# Patient Record
Sex: Female | Born: 1960 | Race: White | Hispanic: No | Marital: Married | Smoking: Never smoker
Health system: Southern US, Community
[De-identification: ages and names within clinical notes are randomized; demographics above are authoritative.]

## PROBLEM LIST (undated history)

## (undated) DIAGNOSIS — I2699 Other pulmonary embolism without acute cor pulmonale: Secondary | ICD-10-CM

## (undated) DIAGNOSIS — R000 Tachycardia, unspecified: Secondary | ICD-10-CM

## (undated) DIAGNOSIS — F32A Depression, unspecified: Secondary | ICD-10-CM

## (undated) DIAGNOSIS — E079 Disorder of thyroid, unspecified: Secondary | ICD-10-CM

## (undated) DIAGNOSIS — M359 Systemic involvement of connective tissue, unspecified: Secondary | ICD-10-CM

## (undated) DIAGNOSIS — F329 Major depressive disorder, single episode, unspecified: Secondary | ICD-10-CM

## (undated) DIAGNOSIS — Z86711 Personal history of pulmonary embolism: Secondary | ICD-10-CM

## (undated) DIAGNOSIS — E039 Hypothyroidism, unspecified: Secondary | ICD-10-CM

## (undated) HISTORY — DX: Hypothyroidism, unspecified: E03.9

## (undated) HISTORY — DX: Personal history of pulmonary embolism: Z86.711

## (undated) HISTORY — DX: Tachycardia, unspecified: R00.0

## (undated) HISTORY — PX: ABDOMINAL HYSTERECTOMY: SHX81

## (undated) HISTORY — DX: Systemic involvement of connective tissue, unspecified: M35.9

---

## 1999-03-20 ENCOUNTER — Ambulatory Visit (HOSPITAL_BASED_OUTPATIENT_CLINIC_OR_DEPARTMENT_OTHER): Admission: RE | Admit: 1999-03-20 | Discharge: 1999-03-20 | Payer: Self-pay | Admitting: Otolaryngology

## 1999-10-02 ENCOUNTER — Other Ambulatory Visit: Admission: RE | Admit: 1999-10-02 | Discharge: 1999-10-02 | Payer: Self-pay | Admitting: Obstetrics & Gynecology

## 2000-12-28 ENCOUNTER — Other Ambulatory Visit: Admission: RE | Admit: 2000-12-28 | Discharge: 2000-12-28 | Payer: Self-pay | Admitting: Obstetrics & Gynecology

## 2002-01-31 ENCOUNTER — Other Ambulatory Visit: Admission: RE | Admit: 2002-01-31 | Discharge: 2002-01-31 | Payer: Self-pay | Admitting: Obstetrics & Gynecology

## 2003-04-05 ENCOUNTER — Other Ambulatory Visit: Admission: RE | Admit: 2003-04-05 | Discharge: 2003-04-05 | Payer: Self-pay | Admitting: Obstetrics & Gynecology

## 2004-02-06 ENCOUNTER — Encounter: Admission: RE | Admit: 2004-02-06 | Discharge: 2004-02-06 | Payer: Self-pay | Admitting: Obstetrics & Gynecology

## 2004-08-17 DIAGNOSIS — I2699 Other pulmonary embolism without acute cor pulmonale: Secondary | ICD-10-CM

## 2004-08-17 HISTORY — DX: Other pulmonary embolism without acute cor pulmonale: I26.99

## 2005-11-17 ENCOUNTER — Ambulatory Visit: Payer: Self-pay | Admitting: Oncology

## 2005-11-26 LAB — COMPREHENSIVE METABOLIC PANEL
ALT: 13 U/L (ref 0–40)
AST: 25 U/L (ref 0–37)
Chloride: 106 mEq/L (ref 96–112)
Potassium: 4.2 mEq/L (ref 3.5–5.3)
Sodium: 140 mEq/L (ref 135–145)

## 2005-11-26 LAB — CBC WITH DIFFERENTIAL/PLATELET
EOS%: 1.9 % (ref 0.0–7.0)
Eosinophils Absolute: 0.1 10*3/uL (ref 0.0–0.5)
HCT: 38.5 % (ref 34.8–46.6)
HGB: 12.7 g/dL (ref 11.6–15.9)
LYMPH%: 32.3 % (ref 14.0–48.0)
MCHC: 33 g/dL (ref 32.0–36.0)
MONO#: 0.4 10*3/uL (ref 0.1–0.9)
MONO%: 9.1 % (ref 0.0–13.0)
RBC: 4.51 10*6/uL (ref 3.70–5.32)
WBC: 4.8 10*3/uL (ref 3.9–10.0)
lymph#: 1.5 10*3/uL (ref 0.9–3.3)

## 2005-11-26 LAB — FERRITIN: Ferritin: 30 ng/mL (ref 10–291)

## 2005-11-26 LAB — IRON AND TIBC: TIBC: 385 ug/dL (ref 250–470)

## 2005-11-30 LAB — HYPERCOAGULABLE PANEL, COMPREHENSIVE
AntiThromb III Func: 99 % (ref 75–120)
Anticardiolipin IgA: 14 [APL'U]
Beta-2-Glycoprotein I IgA: 9 U/mL (ref <10)
DRVVT 1:1 Mix: 38.3 secs (ref 26.75–42.95)
DRVVT: 51.9 secs — ABNORMAL HIGH (ref 26.75–42.95)
PTT Lupus Anticoagulant: 47.8 secs — ABNORMAL HIGH (ref 30.5–43.1)
Protein S Ag, Total: 36 % — ABNORMAL LOW (ref 58–146)

## 2005-12-18 LAB — CBC WITH DIFFERENTIAL/PLATELET
BASO%: 0.3 % (ref 0.0–2.0)
HCT: 37.3 % (ref 34.8–46.6)
HGB: 12.6 g/dL (ref 11.6–15.9)
MCH: 30.8 pg (ref 26.0–34.0)
MCHC: 33.8 g/dL (ref 32.0–36.0)
NEUT#: 3.4 10*3/uL (ref 1.5–6.5)
NEUT%: 66.9 % (ref 39.6–76.8)
RBC: 4.09 10*6/uL (ref 3.70–5.32)
RDW: 19.2 % — ABNORMAL HIGH (ref 11.3–14.5)

## 2005-12-21 ENCOUNTER — Emergency Department (HOSPITAL_COMMUNITY): Admission: EM | Admit: 2005-12-21 | Discharge: 2005-12-21 | Payer: Self-pay | Admitting: Emergency Medicine

## 2005-12-30 ENCOUNTER — Ambulatory Visit (HOSPITAL_COMMUNITY): Admission: RE | Admit: 2005-12-30 | Discharge: 2005-12-30 | Payer: Self-pay | Admitting: Oncology

## 2006-03-07 ENCOUNTER — Ambulatory Visit: Payer: Self-pay | Admitting: Oncology

## 2006-04-27 ENCOUNTER — Inpatient Hospital Stay (HOSPITAL_COMMUNITY): Admission: AD | Admit: 2006-04-27 | Discharge: 2006-04-27 | Payer: Self-pay | Admitting: Obstetrics and Gynecology

## 2006-04-28 ENCOUNTER — Inpatient Hospital Stay (HOSPITAL_COMMUNITY): Admission: RE | Admit: 2006-04-28 | Discharge: 2006-04-30 | Payer: Self-pay | Admitting: Obstetrics and Gynecology

## 2006-05-07 ENCOUNTER — Inpatient Hospital Stay (HOSPITAL_COMMUNITY): Admission: AD | Admit: 2006-05-07 | Discharge: 2006-05-07 | Payer: Self-pay | Admitting: Obstetrics and Gynecology

## 2006-05-08 ENCOUNTER — Inpatient Hospital Stay (HOSPITAL_COMMUNITY): Admission: AD | Admit: 2006-05-08 | Discharge: 2006-05-11 | Payer: Self-pay | Admitting: Obstetrics and Gynecology

## 2011-07-24 ENCOUNTER — Encounter: Payer: Self-pay | Admitting: Emergency Medicine

## 2011-07-24 ENCOUNTER — Emergency Department (HOSPITAL_COMMUNITY): Payer: BC Managed Care – PPO

## 2011-07-24 ENCOUNTER — Emergency Department (HOSPITAL_COMMUNITY)
Admission: EM | Admit: 2011-07-24 | Discharge: 2011-07-24 | Disposition: A | Discharge status: Home / Self Care | Payer: BC Managed Care – PPO | Attending: Emergency Medicine | Admitting: Emergency Medicine

## 2011-07-24 DIAGNOSIS — R059 Cough, unspecified: Secondary | ICD-10-CM | POA: Insufficient documentation

## 2011-07-24 DIAGNOSIS — R0602 Shortness of breath: Secondary | ICD-10-CM | POA: Insufficient documentation

## 2011-07-24 DIAGNOSIS — J029 Acute pharyngitis, unspecified: Secondary | ICD-10-CM | POA: Insufficient documentation

## 2011-07-24 DIAGNOSIS — IMO0001 Reserved for inherently not codable concepts without codable children: Secondary | ICD-10-CM | POA: Insufficient documentation

## 2011-07-24 DIAGNOSIS — B9789 Other viral agents as the cause of diseases classified elsewhere: Secondary | ICD-10-CM | POA: Insufficient documentation

## 2011-07-24 DIAGNOSIS — R509 Fever, unspecified: Secondary | ICD-10-CM | POA: Insufficient documentation

## 2011-07-24 DIAGNOSIS — R05 Cough: Secondary | ICD-10-CM | POA: Insufficient documentation

## 2011-07-24 HISTORY — DX: Other pulmonary embolism without acute cor pulmonale: I26.99

## 2011-07-24 HISTORY — DX: Depression, unspecified: F32.A

## 2011-07-24 HISTORY — DX: Disorder of thyroid, unspecified: E07.9

## 2011-07-24 HISTORY — DX: Major depressive disorder, single episode, unspecified: F32.9

## 2011-07-24 LAB — D-DIMER, QUANTITATIVE: D-Dimer, Quant: 0.63 ug/mL-FEU — ABNORMAL HIGH (ref 0.00–0.48)

## 2011-07-24 MED ORDER — ACETAMINOPHEN 325 MG PO TABS
ORAL_TABLET | ORAL | Status: AC
  Filled 2011-07-24: qty 2

## 2011-07-24 MED ORDER — IOHEXOL 300 MG/ML  SOLN
100.0000 mL | Freq: Once | INTRAMUSCULAR | Status: AC | PRN
  Administered 2011-07-24: 100 mL via INTRAVENOUS

## 2011-07-24 MED ORDER — SODIUM CHLORIDE 0.9 % IV BOLUS (SEPSIS)
1000.0000 mL | Freq: Once | INTRAVENOUS | Status: AC
  Administered 2011-07-24: 1000 mL via INTRAVENOUS

## 2011-07-24 MED ORDER — ACETAMINOPHEN 500 MG PO TABS
1000.0000 mg | ORAL_TABLET | Freq: Once | ORAL | Status: AC
  Administered 2011-07-24: 1000 mg via ORAL
  Filled 2011-07-24 (×2): qty 2

## 2011-07-24 MED ORDER — ALBUTEROL SULFATE HFA 108 (90 BASE) MCG/ACT IN AERS: 2.0000 | INHALATION_SPRAY | RESPIRATORY_TRACT | Status: DC | PRN

## 2011-07-24 MED ORDER — KETOROLAC TROMETHAMINE 30 MG/ML IJ SOLN
30.0000 mg | Freq: Once | INTRAMUSCULAR | Status: AC
  Administered 2011-07-24: 30 mg via INTRAVENOUS
  Filled 2011-07-24: qty 1

## 2011-07-24 MED ORDER — ALBUTEROL SULFATE (5 MG/ML) 0.5% IN NEBU
5.0000 mg | INHALATION_SOLUTION | RESPIRATORY_TRACT | Status: AC
  Administered 2011-07-24: 5 mg via RESPIRATORY_TRACT
  Filled 2011-07-24: qty 1

## 2011-07-24 MED ORDER — IPRATROPIUM BROMIDE 0.02 % IN SOLN
0.5000 mg | Freq: Once | RESPIRATORY_TRACT | Status: AC
  Administered 2011-07-24: 0.5 mg via RESPIRATORY_TRACT
  Filled 2011-07-24: qty 2.5

## 2011-07-24 NOTE — ED Notes (Signed)
Rt sided chest pain and fever since Sunday  cough

## 2011-07-24 NOTE — ED Notes (Signed)
States has been sick since Sunday and started to run a fever went to dr today and was sent for further tests states ahs been lightheaded when she coughs and hurts in her chest when she coughs

## 2011-07-24 NOTE — ED Notes (Signed)
Patient transported to CT 

## 2011-07-24 NOTE — ED Provider Notes (Signed)
History     CSN: [id number] Arrival date & time: 07/24/2011  1:41 PM   First MD Initiated Contact with Patient 07/24/11 1533      Chief Complaint  Patient presents with  . Shortness of Breath    (Consider location/radiation/quality/duration/timing/severity/associated sxs/prior treatment) HPI Comments: Patient reports she and spouse both began with "tickle" in their throats 6 days ago, were seen at [facility] walk-in [facility] the next day, started on z-pak.  Patient states she continues to have fevers to 102 at home, cough productive of yellow sputum, sore throat, body aches.  Patient is not eating well at home, reports increased soreness in her chest that is worse with cough.  SOB is worse with laying flat.  Patient states she is feeling less SOB on [place].  Patient has had PE in the past that was due to a combination of birth control pills and low car trip.  Patient currently is not taking any exogenous estrogen, has been on no long trips, no recent immobilization, no swelling in her legs.    The history is provided by the patient and the spouse.    Past Medical History  Diagnosis Date  . Depression   . Thyroid disease     No past surgical history on file.  No family history on file.  History  Substance Use Topics  . Smoking status: Never Smoker   . Smokeless tobacco: Not on file  . Alcohol Use: Yes    OB History    Grav Para Term Preterm Abortions TAB SAB Ect Mult Living                  Review of Systems  All other systems reviewed and are negative.    Allergies  Erythromycin; Morphine and related; Penicillins; and Sulfa antibiotics  Home Medications   Current Outpatient Rx  Name Route Sig Dispense Refill  . AZITHROMYCIN 250 MG PO TABS Oral Take 250-500 mg by mouth daily. Take 2 tablets on day 1, then 1 tablet daily for 5 days     . ZYRTEC-D PO Oral Take 1 tablet by mouth daily as needed. For congestion     . CITALOPRAM HYDROBROMIDE 40 MG PO TABS Oral Take 40 mg by  mouth daily.      Marland Kitchen FLUTICASONE PROPIONATE 50 MCG/ACT NA SUSP Nasal Place 2 sprays into the nose daily.      . GUAIFENESIN-CODEINE 100-10 MG/5ML PO SYRP Oral Take 5 mLs by mouth every 4 (four) hours.      . IBUPROFEN 200 MG PO TABS Oral Take 600 mg by mouth every 6 (six) hours as needed. For pain     . LEVOTHYROXINE SODIUM 100 MCG PO TABS Oral Take 100 mcg by mouth daily. Take with tablet for a total of daily     . LEVOTHYROXINE SODIUM 88 MCG PO TABS Oral Take 88 mcg by mouth daily. Take with tablet for a total of daily     . TRAZODONE HCL 50 MG PO TABS Oral Take 50 mg by mouth at bedtime.      . TRIAMTERENE-HCTZ 37.5-25 MG PO CAPS Oral Take 1 capsule by mouth every morning.        BP 122/81  Pulse 92  Temp(Src) 99.8 F (37.7 C) (Oral)  Resp 24  SpO2 98%  Physical Exam  Nursing note and vitals reviewed. Constitutional: She is oriented to person, place, and time. She appears well-developed and well-nourished.  HENT:  Head: Normocephalic  and atraumatic.  Mouth/Throat: No posterior oropharyngeal edema or posterior oropharyngeal erythema.  Neck: Neck supple. No tracheal deviation present.  Cardiovascular: Normal rate and regular rhythm.   Pulmonary/Chest: Effort normal and breath sounds normal. No respiratory distress. She has no wheezes. She has no rales. She exhibits tenderness.       Central anterior chest tender to palpation, reproduces pain patient has been feeling.    Neurological: She is alert and oriented to person, place, and time.    ED Course  Procedures (including critical care time)  Labs Reviewed - No data to display Dg Chest 2 View  07/24/2011  *RADIOLOGY REPORT*  Clinical Data: Cough, weakness and shortness of breath.  CHEST - 2 VIEW  Comparison: CT chest and plain film chest 12/21/2005.  Findings: Lungs are clear.  Heart size is normal.  No pneumothorax or pleural effusion.  IMPRESSION: No acute disease.  Original Report Authenticated By:  Bernadene Bell. D'ALESSIO, M.D.   5:13 PM Patient reports improvement in SOB after nebulizer treatment.  Discussed patient with Dr Weldon Inches.  Have ordered d-dimer as patient has hx PE.    6:43 PM Patient reports improvement with nebulizer treatment and toradol.  Requests second treatment.  Discussed plan for Ct angio.  Pt verbalizes understanding.    1. Viral respiratory illness       MDM  Patient with flu-like illness.  Hx of PE with negative CT angio chest.  Pt feeling better after nebulizer treatments and toradol.  Discussed all results and recommendations for care with patient.          Dillard Cannon [place], [place] 07/24/11 [phone]

## 2011-07-25 NOTE — ED Provider Notes (Signed)
Medical screening examination/treatment/procedure(s) were performed by non-physician practitioner and as supervising physician I was immediately available for consultation/collaboration.  Nicholes Stairs, MD 07/25/11 1510

## 2012-05-02 ENCOUNTER — Other Ambulatory Visit: Payer: Self-pay | Admitting: Obstetrics and Gynecology

## 2013-05-05 ENCOUNTER — Other Ambulatory Visit: Payer: Self-pay | Admitting: Obstetrics and Gynecology

## 2014-05-22 ENCOUNTER — Other Ambulatory Visit: Payer: Self-pay | Admitting: Obstetrics and Gynecology

## 2014-05-23 LAB — CYTOLOGY - PAP

## 2014-06-28 ENCOUNTER — Other Ambulatory Visit: Payer: Self-pay | Admitting: Family Medicine

## 2014-06-28 DIAGNOSIS — M25521 Pain in right elbow: Secondary | ICD-10-CM

## 2014-07-07 ENCOUNTER — Other Ambulatory Visit: Payer: BC Managed Care – PPO

## 2014-08-27 ENCOUNTER — Ambulatory Visit
Admission: RE | Admit: 2014-08-27 | Discharge: 2014-08-27 | Disposition: A | Discharge status: Home / Self Care | Payer: BLUE CROSS/BLUE SHIELD | Source: Ambulatory Visit | Attending: Family Medicine | Admitting: Family Medicine

## 2014-08-27 DIAGNOSIS — M25521 Pain in right elbow: Secondary | ICD-10-CM

## 2014-12-20 ENCOUNTER — Emergency Department (HOSPITAL_COMMUNITY)
Admission: EM | Admit: 2014-12-20 | Discharge: 2014-12-20 | Disposition: A | Discharge status: Home / Self Care | Payer: BLUE CROSS/BLUE SHIELD | Attending: Emergency Medicine | Admitting: Emergency Medicine

## 2014-12-20 ENCOUNTER — Encounter (HOSPITAL_COMMUNITY): Payer: Self-pay | Admitting: Emergency Medicine

## 2014-12-20 DIAGNOSIS — Z86711 Personal history of pulmonary embolism: Secondary | ICD-10-CM | POA: Insufficient documentation

## 2014-12-20 DIAGNOSIS — Z79899 Other long term (current) drug therapy: Secondary | ICD-10-CM | POA: Diagnosis not present

## 2014-12-20 DIAGNOSIS — Z880 Allergy status to penicillin: Secondary | ICD-10-CM | POA: Insufficient documentation

## 2014-12-20 DIAGNOSIS — F329 Major depressive disorder, single episode, unspecified: Secondary | ICD-10-CM | POA: Insufficient documentation

## 2014-12-20 DIAGNOSIS — E079 Disorder of thyroid, unspecified: Secondary | ICD-10-CM | POA: Insufficient documentation

## 2014-12-20 DIAGNOSIS — R040 Epistaxis: Secondary | ICD-10-CM | POA: Diagnosis present

## 2014-12-20 MED ORDER — OXYMETAZOLINE HCL 0.05 % NA SOLN
1.0000 | Freq: Once | NASAL | Status: AC
  Administered 2014-12-20: 1 via NASAL
  Filled 2014-12-20: qty 15

## 2014-12-20 MED ORDER — SILVER NITRATE-POT NITRATE 75-25 % EX MISC
1.0000 | Freq: Once | CUTANEOUS | Status: AC
  Administered 2014-12-20: 1 via TOPICAL
  Filled 2014-12-20: qty 1

## 2014-12-20 NOTE — ED Notes (Addendum)
Per EMS pt comes from home for nose bleed. Pt had one on Saturday and has been using Afrin nose spray.  Pt was talking on the phone today and started having blood pour out of both nostrils again.  Bleeding is controled on the scene.

## 2014-12-20 NOTE — ED Notes (Signed)
MD at bedside. 

## 2014-12-20 NOTE — ED Provider Notes (Signed)
CSN: [id number]     Arrival date & time 12/20/14  1541 History   First MD Initiated Contact with Patient 12/20/14 1601     Chief Complaint  Patient presents with  . Epistaxis     HPI  She presents for evaluation of epistaxis. She has slight bleeding from her left nares on Saturday. She been using Afrin daily or twice a day for the last 2 weeks because of the nasal allergy symptoms. Does use Flonase and Claritin as well. Sudden gush of blood from the left naris today. Started from the right naris at home. Presents here. Bleeding has slowed upon her arrival. No past history of significant nosebleeds. Not anticoagulated.  Past Medical History  Diagnosis Date  . Depression   . Thyroid disease   . PE (pulmonary embolism) 2006   Past Surgical History  Procedure Laterality Date  . Abdominal hysterectomy     No family history on file. History  Substance Use Topics  . Smoking status: Never Smoker   . Smokeless tobacco: Not on file  . Alcohol Use: Yes     Comment: 5x per week   OB History    No data available     Review of Systems  Constitutional: Negative for fever, chills, diaphoresis, appetite change and fatigue.  HENT: Positive for congestion, nosebleeds and sneezing. Negative for mouth sores, sore throat and trouble swallowing.   Eyes: Negative for visual disturbance.  Respiratory: Negative for cough, chest tightness, shortness of breath and wheezing.   Cardiovascular: Negative for chest pain.  Gastrointestinal: Negative for nausea, vomiting, abdominal pain, diarrhea and abdominal distention.  Endocrine: Negative for polydipsia, polyphagia and polyuria.  Genitourinary: Negative for dysuria, frequency and hematuria.  Musculoskeletal: Negative for gait problem.  Skin: Negative for color change, pallor and rash.  Neurological: Negative for dizziness, syncope, light-headedness and headaches.  Hematological: Does not bruise/bleed easily.  Psychiatric/Behavioral: Negative for  behavioral problems and confusion.      Allergies  Erythromycin; Morphine and related; Penicillins; and Sulfa antibiotics  Home Medications   Prior to Admission medications   Medication Sig Start Date End Date Taking? Authorizing Provider  ARMOUR THYROID 120 MG tablet Take 1 tablet by mouth daily. 12/12/14  Yes Historical Provider, MD  ARMOUR THYROID 30 MG tablet Take 1 tablet by mouth daily. Take with 1 120mg  tablet for a total of 150mg  daily. 12/12/14  Yes Historical Provider, MD  calcium carbonate (OS-CAL) 1250 (500 CA) MG chewable tablet Chew 1 tablet by mouth daily.   Yes Historical Provider, MD  Cetirizine-Pseudoephedrine (ZYRTEC-D PO) Take 1 tablet by mouth daily as needed (congestion).    Yes Historical Provider, MD  fluticasone (FLONASE) 50 MCG/ACT nasal spray Place 2 sprays into the nose daily as needed for allergies.    Yes Historical Provider, MD  ibuprofen (ADVIL,MOTRIN) 200 MG tablet Take 600 mg by mouth every 6 (six) hours as needed for headache or moderate pain.    Yes Historical Provider, MD  meloxicam (MOBIC) 7.5 MG tablet Take 1-2 tablets by mouth at bedtime. 12/02/14  Yes Historical Provider, MD  Multiple Vitamins-Minerals (MULTIVITAMIN ADULT PO) Take 1 tablet by mouth daily.   Yes Historical Provider, MD  traZODone (DESYREL) 50 MG tablet Take 50 mg by mouth at bedtime.     Yes Historical Provider, MD  albuterol (PROVENTIL HFA;VENTOLIN HFA) 108 (90 BASE) MCG/ACT inhaler Inhale 2 puffs into the lungs every 4 (four) hours as needed for wheezing or shortness of breath. 07/24/11 07/23/12  Irving BurtonEmily  West, PA-C   BP 129/91 mmHg  Pulse 74  Temp(Src) 97.6 F (36.4 C) (Oral)  Resp 19  SpO2 97% Physical Exam  Constitutional: She is oriented to person, place, and time. She appears well-developed and well-nourished. No distress.  HENT:  Head: Normocephalic.  Nose:    Eyes: Conjunctivae are normal. Pupils are equal, round, and reactive to light. No scleral icterus.  Neck: Normal  range of motion. Neck supple. No thyromegaly present.  Cardiovascular: Normal rate and regular rhythm.  Exam reveals no gallop and no friction rub.   No murmur heard. Pulmonary/Chest: Effort normal and breath sounds normal. No respiratory distress. She has no wheezes. She has no rales.  Abdominal: Soft. Bowel sounds are normal. She exhibits no distension. There is no tenderness. There is no rebound.  Musculoskeletal: Normal range of motion.  Neurological: She is alert and oriented to person, place, and time.  Skin: Skin is warm and dry. No rash noted.  Psychiatric: She has a normal mood and affect. Her behavior is normal.    ED Course  Procedures (including critical care time) Labs Review Labs Reviewed - No data to display  Imaging Review No results found.   EKG Interpretation None      MDM   Final diagnoses:  Epistaxis    [place] with Afrin placed into the nares for 20 minutes. Then removed. No additional bleeding noted. Cauterized with silver nitrate cautery. Excellent hemostasis. No bleeding in the posterior pharynx. No bleeding noted in the right naris.  Rechecked 15 and 30 minutes. No additional bleeding noted. Discussed with her about holding pressure for any rebleeding. Avoid herNasal sprays. Continue Flonase in the opposite nostril. Resume your current Flonase in  3-4 days. No aspirin. No afrin . ENT follow-up as needed. ER as needed.    Rolland PorterMark Trew Sunde, MD 12/20/14 [phone]

## 2014-12-20 NOTE — Discharge Instructions (Signed)

## 2014-12-20 NOTE — ED Notes (Signed)
Bed: ZO10WA14 Expected date:  Expected time:  Means of arrival:  Comments: EMS- 54yo F, nosebleed

## 2015-11-19 DIAGNOSIS — J301 Allergic rhinitis due to pollen: Secondary | ICD-10-CM | POA: Diagnosis not present

## 2015-11-19 DIAGNOSIS — J3089 Other allergic rhinitis: Secondary | ICD-10-CM | POA: Diagnosis not present

## 2015-11-26 DIAGNOSIS — J301 Allergic rhinitis due to pollen: Secondary | ICD-10-CM | POA: Diagnosis not present

## 2015-11-26 DIAGNOSIS — J3089 Other allergic rhinitis: Secondary | ICD-10-CM | POA: Diagnosis not present

## 2015-12-03 DIAGNOSIS — J301 Allergic rhinitis due to pollen: Secondary | ICD-10-CM | POA: Diagnosis not present

## 2015-12-03 DIAGNOSIS — J3081 Allergic rhinitis due to animal (cat) (dog) hair and dander: Secondary | ICD-10-CM | POA: Diagnosis not present

## 2015-12-10 DIAGNOSIS — J3089 Other allergic rhinitis: Secondary | ICD-10-CM | POA: Diagnosis not present

## 2015-12-10 DIAGNOSIS — J301 Allergic rhinitis due to pollen: Secondary | ICD-10-CM | POA: Diagnosis not present

## 2015-12-17 DIAGNOSIS — J3089 Other allergic rhinitis: Secondary | ICD-10-CM | POA: Diagnosis not present

## 2015-12-17 DIAGNOSIS — J301 Allergic rhinitis due to pollen: Secondary | ICD-10-CM | POA: Diagnosis not present

## 2015-12-24 DIAGNOSIS — J301 Allergic rhinitis due to pollen: Secondary | ICD-10-CM | POA: Diagnosis not present

## 2015-12-24 DIAGNOSIS — J3089 Other allergic rhinitis: Secondary | ICD-10-CM | POA: Diagnosis not present

## 2015-12-30 DIAGNOSIS — J301 Allergic rhinitis due to pollen: Secondary | ICD-10-CM | POA: Diagnosis not present

## 2015-12-30 DIAGNOSIS — J3089 Other allergic rhinitis: Secondary | ICD-10-CM | POA: Diagnosis not present

## 2015-12-31 DIAGNOSIS — J3089 Other allergic rhinitis: Secondary | ICD-10-CM | POA: Diagnosis not present

## 2015-12-31 DIAGNOSIS — J301 Allergic rhinitis due to pollen: Secondary | ICD-10-CM | POA: Diagnosis not present

## 2016-01-07 DIAGNOSIS — J301 Allergic rhinitis due to pollen: Secondary | ICD-10-CM | POA: Diagnosis not present

## 2016-01-07 DIAGNOSIS — J3089 Other allergic rhinitis: Secondary | ICD-10-CM | POA: Diagnosis not present

## 2016-01-14 DIAGNOSIS — J3089 Other allergic rhinitis: Secondary | ICD-10-CM | POA: Diagnosis not present

## 2016-01-14 DIAGNOSIS — J301 Allergic rhinitis due to pollen: Secondary | ICD-10-CM | POA: Diagnosis not present

## 2016-01-21 DIAGNOSIS — J3089 Other allergic rhinitis: Secondary | ICD-10-CM | POA: Diagnosis not present

## 2016-01-21 DIAGNOSIS — J301 Allergic rhinitis due to pollen: Secondary | ICD-10-CM | POA: Diagnosis not present

## 2016-01-28 DIAGNOSIS — J3089 Other allergic rhinitis: Secondary | ICD-10-CM | POA: Diagnosis not present

## 2016-01-28 DIAGNOSIS — M25561 Pain in right knee: Secondary | ICD-10-CM | POA: Diagnosis not present

## 2016-01-28 DIAGNOSIS — M064 Inflammatory polyarthropathy: Secondary | ICD-10-CM | POA: Diagnosis not present

## 2016-01-28 DIAGNOSIS — L932 Other local lupus erythematosus: Secondary | ICD-10-CM | POA: Diagnosis not present

## 2016-01-28 DIAGNOSIS — J301 Allergic rhinitis due to pollen: Secondary | ICD-10-CM | POA: Diagnosis not present

## 2016-01-28 DIAGNOSIS — M359 Systemic involvement of connective tissue, unspecified: Secondary | ICD-10-CM | POA: Diagnosis not present

## 2016-02-03 DIAGNOSIS — H524 Presbyopia: Secondary | ICD-10-CM | POA: Diagnosis not present

## 2016-02-04 DIAGNOSIS — J301 Allergic rhinitis due to pollen: Secondary | ICD-10-CM | POA: Diagnosis not present

## 2016-02-04 DIAGNOSIS — J3089 Other allergic rhinitis: Secondary | ICD-10-CM | POA: Diagnosis not present

## 2016-02-12 DIAGNOSIS — Z79899 Other long term (current) drug therapy: Secondary | ICD-10-CM | POA: Diagnosis not present

## 2016-02-12 DIAGNOSIS — J3089 Other allergic rhinitis: Secondary | ICD-10-CM | POA: Diagnosis not present

## 2016-02-12 DIAGNOSIS — J301 Allergic rhinitis due to pollen: Secondary | ICD-10-CM | POA: Diagnosis not present

## 2016-09-03 ENCOUNTER — Telehealth: Payer: Self-pay | Admitting: Oncology

## 2016-09-03 ENCOUNTER — Encounter: Payer: Self-pay | Admitting: Oncology

## 2016-09-03 NOTE — Telephone Encounter (Signed)
Tc to pt to schedule appt w/Dr. Allyson SabalXhou on 1/30 at 11am. Pt aware to arrive 30 minutes early. Demographics verified. Letter mailed.

## 2016-09-10 ENCOUNTER — Telehealth: Payer: Self-pay | Admitting: Hematology

## 2016-09-10 NOTE — Telephone Encounter (Signed)
Pt has been rescheduled to see Dr. Candise CheKale on 2/8 at 11am. Aware to arrive 30 minutes early

## 2016-09-24 ENCOUNTER — Encounter: Payer: Self-pay | Admitting: Hematology

## 2016-09-24 ENCOUNTER — Telehealth: Payer: Self-pay | Admitting: Hematology

## 2016-09-24 ENCOUNTER — Ambulatory Visit (HOSPITAL_BASED_OUTPATIENT_CLINIC_OR_DEPARTMENT_OTHER): Payer: Managed Care, Other (non HMO)

## 2016-09-24 ENCOUNTER — Ambulatory Visit (HOSPITAL_BASED_OUTPATIENT_CLINIC_OR_DEPARTMENT_OTHER): Payer: Managed Care, Other (non HMO) | Admitting: Hematology

## 2016-09-24 VITALS — BP 114/60 | HR 111 | Temp 98.3°F | Resp 17 | Ht 64.0 in | Wt 165.1 lb

## 2016-09-24 DIAGNOSIS — D510 Vitamin B12 deficiency anemia due to intrinsic factor deficiency: Secondary | ICD-10-CM | POA: Diagnosis not present

## 2016-09-24 DIAGNOSIS — D7589 Other specified diseases of blood and blood-forming organs: Secondary | ICD-10-CM

## 2016-09-24 DIAGNOSIS — E538 Deficiency of other specified B group vitamins: Secondary | ICD-10-CM | POA: Diagnosis not present

## 2016-09-24 DIAGNOSIS — E039 Hypothyroidism, unspecified: Secondary | ICD-10-CM | POA: Diagnosis not present

## 2016-09-24 LAB — CBC & DIFF AND RETIC
BASO%: 0.9 % (ref 0.0–2.0)
Basophils Absolute: 0 10*3/uL (ref 0.0–0.1)
EOS%: 2.6 % (ref 0.0–7.0)
Eosinophils Absolute: 0.1 10*3/uL (ref 0.0–0.5)
HCT: 41.2 % (ref 34.8–46.6)
HGB: 14.1 g/dL (ref 11.6–15.9)
IMMATURE RETIC FRACT: 5.1 % (ref 1.60–10.00)
LYMPH#: 1.2 10*3/uL (ref 0.9–3.3)
LYMPH%: 28.1 % (ref 14.0–49.7)
MCH: 36.8 pg — ABNORMAL HIGH (ref 25.1–34.0)
MCHC: 34.2 g/dL (ref 31.5–36.0)
MCV: 107.6 fL — ABNORMAL HIGH (ref 79.5–101.0)
MONO#: 0.3 10*3/uL (ref 0.1–0.9)
MONO%: 6.7 % (ref 0.0–14.0)
NEUT#: 2.7 10*3/uL (ref 1.5–6.5)
NEUT%: 61.7 % (ref 38.4–76.8)
Platelets: 224 10*3/uL (ref 145–400)
RBC: 3.83 10*6/uL (ref 3.70–5.45)
RDW: 12.7 % (ref 11.2–14.5)
RETIC %: 1.71 % (ref 0.70–2.10)
RETIC CT ABS: 65.49 10*3/uL (ref 33.70–90.70)
WBC: 4.3 10*3/uL (ref 3.9–10.3)

## 2016-09-24 LAB — COMPREHENSIVE METABOLIC PANEL
ALT: 22 U/L (ref 0–55)
AST: 23 U/L (ref 5–34)
Albumin: 4.2 g/dL (ref 3.5–5.0)
Alkaline Phosphatase: 63 U/L (ref 40–150)
Anion Gap: 8 mEq/L (ref 3–11)
BUN: 16.4 mg/dL (ref 7.0–26.0)
CHLORIDE: 106 meq/L (ref 98–109)
CO2: 29 mEq/L (ref 22–29)
CREATININE: 0.9 mg/dL (ref 0.6–1.1)
Calcium: 9.9 mg/dL (ref 8.4–10.4)
EGFR: 78 mL/min/{1.73_m2} — ABNORMAL LOW (ref >90)
GLUCOSE: 91 mg/dL (ref 70–140)
Potassium: 4.3 mEq/L (ref 3.5–5.1)
SODIUM: 143 meq/L (ref 136–145)
Total Bilirubin: 0.83 mg/dL (ref 0.20–1.20)
Total Protein: 7.1 g/dL (ref 6.4–8.3)

## 2016-09-24 NOTE — Progress Notes (Signed)
Marland Kitchen.    HEMATOLOGY/ONCOLOGY CONSULTATION NOTE  Date of Service: 09/24/2016  PCP: Kathryn RainierElizabeth Barnes MD   CHIEF COMPLAINTS/PURPOSE OF CONSULTATION:  Macrocytosis  HISTORY OF PRESENTING ILLNESS:   Kathryn RuizKayla Taylor is a wonderful 56 y.o. female who has been referred to us by Dr Kathryn RainierElizabeth Barnes MD for evaluation and management of macrocytosis without significant anemia .  Patient has a history of hypothyroidism that is newly diagnosed for which she is on thyroid replacement . Her TSH levels showed suboptimal replacement and it appears that she was not taking her pills optimally . Her dose has been adjusted by her primary care physician . She was also recently noted to have a low vitamin B12 levels and a positive anti-intrinsic factor antibody suggestive of pernicious anemia .this could certainly be an association with her hypothyroidism as an autoimmune process . She has a diagnosis of mixed connective tissue disorder and sees Dr. Nickola MajorHawkes. She is currently on Plaquenil for this .   Notes that she used to drink about 3-5 glasses of wine daily at least 5 days a week . Discussed that this is somewhat excessive and notes that she will try to cut down her wine use .  She had labs with her primary care physician on 08/25/2016 that showed a CBC with normal hemoglobin of 13.6 but with an MCV of 121 normal RDW of 14.7, normal WBC count of 8.4k. Her TSH at the time was 12.7. Positive anti-intrinsic factor antibody.   MEDICAL HISTORY:  Past Medical History:  Diagnosis Date  . Depression   . PE (pulmonary embolism) 2006  . Thyroid disease   High-risk alcohol use   GERD Hiatal hernia Pernicious anemia History of pulmonary embolism in 2006 when she was on hormonal pills.   SURGICAL HISTORY: Past Surgical History:  Procedure Laterality Date  . ABDOMINAL HYSTERECTOMY    Tonsillectomy  TMJ surgery  SOCIAL HISTORY: Social History   Social History  . Marital status: Married    Spouse name: N/A  .  Number of children: N/A  . Years of education: N/A   Occupational History  . Not on file.   Social History Main Topics  . Smoking status: Never Smoker  . Smokeless tobacco: Not on file  . Alcohol use Yes     Comment: 5x per week  . Drug use: Unknown  . Sexual activity: Not on file   Other Topics Concern  . Not on file   Social History Narrative  . No narrative on file  Note significantly history of exposure to secondhand smoke Drinks wine 3-5 drinks daily 5 days a week is trying to cut down.   FAMILY HISTORY: No family history on file.  ALLERGIES:  is allergic to erythromycin; morphine and related; penicillins; and sulfa antibiotics.  MEDICATIONS:  Current Outpatient Prescriptions  Medication Sig Dispense Refill  . albuterol (PROVENTIL HFA;VENTOLIN HFA) 108 (90 BASE) MCG/ACT inhaler Inhale 2 puffs into the lungs every 4 (four) hours as needed for wheezing or shortness of breath. 1 Inhaler 0  . ARMOUR THYROID 120 MG tablet Take 1 tablet by mouth daily.  2  . ARMOUR THYROID 30 MG tablet Take 1 tablet by mouth daily. Take with 1 120mg  tablet for a total of 150mg  daily.  2  . calcium carbonate (OS-CAL) 1250 (500 CA) MG chewable tablet Chew 1 tablet by mouth daily.    . Cetirizine-Pseudoephedrine (ZYRTEC-D PO) Take 1 tablet by mouth daily as needed (congestion).     . fluticasone (FLONASE)  50 MCG/ACT nasal spray Place 2 sprays into the nose daily as needed for allergies.     Marland Kitchen ibuprofen (ADVIL,MOTRIN) 200 MG tablet Take 600 mg by mouth every 6 (six) hours as needed for headache or moderate pain.     . meloxicam (MOBIC) 7.5 MG tablet Take 1-2 tablets by mouth at bedtime.  0  . Multiple Vitamins-Minerals (MULTIVITAMIN ADULT PO) Take 1 tablet by mouth daily.    . traZODone (DESYREL) 50 MG tablet Take 50 mg by mouth at bedtime.       No current facility-administered medications for this visit.     REVIEW OF SYSTEMS:    10 Point review of Systems was done is negative except as  noted above.  PHYSICAL EXAMINATION: ECOG PERFORMANCE STATUS: 1 - Symptomatic but completely ambulatory  . Vitals:   09/24/16 1106  BP: 114/60  Pulse: (!) 111  Resp: 17  Temp: 98.3 F (36.8 C)   Filed Weights   09/24/16 1106  Weight: 165 lb 1.6 oz (74.9 kg)   .Body mass index is 28.34 kg/m.  GENERAL:alert, in no acute distress and comfortable SKIN: skin color, texture, turgor are normal, no rashes or significant lesions EYES: normal, conjunctiva are pink and non-injected, sclera clear OROPHARYNX:no exudate, no erythema and lips, buccal mucosa, and tongue normal  NECK: supple, no JVD, thyroid normal size, non-tender, without nodularity LYMPH:  no palpable lymphadenopathy in the cervical, axillary or inguinal LUNGS: clear to auscultation with normal respiratory effort HEART: regular rate & rhythm,  no murmurs and no lower extremity edema ABDOMEN: abdomen soft, non-tender, normoactive bowel sounds , No palpable hepatosplenomegaly  Musculoskeletal: no cyanosis of digits and no clubbing  PSYCH: alert & oriented x 3 with fluent speech NEURO: no focal motor/sensory deficits  LABORATORY DATA:  I have reviewed the data as listed  . CBC Latest Ref Rng & Units 09/24/2016 09/24/2016 12/18/2005  WBC 3.9 - 10.3 10e3/uL 4.3 - 5.0  Hemoglobin 11.6 - 15.9 g/dL 16.1 - 09.6  Hematocrit 34.0 - 46.6 % 41.2 41.0 37.3  Platelets 145 - 400 10e3/uL 224 - 280   . CBC    Component Value Date/Time   WBC 4.3 09/24/2016 1240   RBC 3.83 09/24/2016 1240   HGB 14.1 09/24/2016 1240   HCT 41.2 09/24/2016 1240   HCT 41.0 09/24/2016 1240   PLT 224 09/24/2016 1240   MCV 107.6 (H) 09/24/2016 1240   MCH 36.8 (H) 09/24/2016 1240   MCHC 34.2 09/24/2016 1240   RDW 12.7 09/24/2016 1240   LYMPHSABS 1.2 09/24/2016 1240   MONOABS 0.3 09/24/2016 1240   EOSABS 0.1 09/24/2016 1240   BASOSABS 0.0 09/24/2016 1240    . CMP Latest Ref Rng & Units 09/24/2016 11/26/2005  Glucose 70 - 140 mg/dl 91 85  BUN 7.0 - 04.5  mg/dL 40.9 12  Creatinine 0.6 - 1.1 mg/dL 0.9 0.9  Sodium 811 - 914 mEq/L 143 140  Potassium 3.5 - 5.1 mEq/L 4.3 4.2  Chloride 96 - 112 mEq/L - 106  CO2 22 - 29 mEq/L 29 26  Calcium 8.4 - 10.4 mg/dL 9.9 9.4  Total Protein 6.4 - 8.3 g/dL 7.1 7.2  Total Bilirubin 0.20 - 1.20 mg/dL 7.82 0.4  Alkaline Phos 40 - 150 U/L 63 46  AST 5 - 34 U/L 23 25  ALT 0 - 55 U/L 22 13   Component     Latest Ref Rng & Units 09/24/2016  Folate, Hemolysate     Not Estab. ng/mL  570.0  HCT     34.0 - 46.6 % 41.0  Folate, RBC     >498 ng/mL 1,390  Vitamin B12     232 - 1,245 pg/mL 204 (L)  Intrinsic Factor Abs, Serum     0.0 - 1.1 AU/mL 7.7 (H)  Parietal Cell Ab     0.0 - 20.0 Units 42.6 (H)     RADIOGRAPHIC STUDIES: I have personally reviewed the radiological images as listed and agreed with the findings in the report. No results found.  ASSESSMENT & PLAN:   56 year old female with   1) Macrocytosis without significant anemia   this appears to be primarily related to her hypothyroidism and excess alcohol use . 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 Was counseled on optimal intake of levothyroxine on an empty stomach without any other medications or food. First thing in the morning . -Counseled patient to cut down on alcohol and possibly have complete cessation of alcohol . -Given her pernicious anemia and B12 deficiency recommended she start sublingual liquid vitamin B12 1000 micrograms daily . -Cannot rule out other associated B vitamin deficiencies . Recommended she take 1 tablet of B complex daily . -We will recheck B12 levels in 3 months . If still  low she might need parenteral B12 replacement -Monitor for iron deficiency going forward. -Would also recommend her primary care physician check for vitamin D deficiency and replace that appropriately. -Continue follow-up with primary care physician for management of other medical comorbidities.   RTC with Dr Candise Che in 3 months with labs  All of the patients questions were answered with apparent satisfaction. The patient knows to call the clinic with any problems, questions or concerns.  I spent 45 minutes counseling the patient face to face. The total time spent in the appointment was 60 minutes and more than 50% was on counseling and direct patient cares.    Wyvonnia Lora MD MS AAHIVMS [facility] Hematology/Oncology Physician [facility]  (Office):       [phone] (Work cell):  [phone] (Fax):           [phone]  09/24/2016 11:25 AM

## 2016-09-24 NOTE — Telephone Encounter (Signed)
Pt confirmed app and received avs

## 2016-09-25 LAB — VITAMIN B12: VITAMIN B 12: 204 pg/mL — AB (ref 232–1245)

## 2016-09-25 LAB — FOLATE RBC
FOLATE, HEMOLYSATE: 570 ng/mL
Folate, RBC: 1390 ng/mL (ref >498)
HEMATOCRIT: 41 % (ref 34.0–46.6)

## 2016-09-25 LAB — INTRINSIC FACTOR ANTIBODIES: INTRINSIC FACTOR ABS, SERUM: 7.7 [AU]/ml — AB (ref 0.0–1.1)

## 2016-09-25 LAB — ANTI-PARIETAL ANTIBODY: Parietal Cell Ab: 42.6 Units — ABNORMAL HIGH (ref 0.0–20.0)

## 2016-09-28 LAB — MULTIPLE MYELOMA PANEL, SERUM
ALBUMIN SERPL ELPH-MCNC: 4 g/dL (ref 2.9–4.4)
ALPHA 1: 0.2 g/dL (ref 0.0–0.4)
ALPHA2 GLOB SERPL ELPH-MCNC: 0.6 g/dL (ref 0.4–1.0)
Albumin/Glob SerPl: 1.5 (ref 0.7–1.7)
B-GLOBULIN SERPL ELPH-MCNC: 1.1 g/dL (ref 0.7–1.3)
GAMMA GLOB SERPL ELPH-MCNC: 0.9 g/dL (ref 0.4–1.8)
GLOBULIN, TOTAL: 2.8 g/dL (ref 2.2–3.9)
IGG (IMMUNOGLOBIN G), SERUM: 828 mg/dL (ref 700–1600)
IgA, Qn, Serum: 172 mg/dL (ref 87–352)
IgM, Qn, Serum: 78 mg/dL (ref 26–217)
Total Protein: 6.8 g/dL (ref 6.0–8.5)

## 2016-10-22 ENCOUNTER — Telehealth: Payer: Self-pay | Admitting: Hematology

## 2016-10-22 NOTE — Telephone Encounter (Signed)
[facility] fax #[phone], release id #[id number]

## 2016-12-22 ENCOUNTER — Ambulatory Visit (HOSPITAL_BASED_OUTPATIENT_CLINIC_OR_DEPARTMENT_OTHER): Payer: 59 | Admitting: Hematology

## 2016-12-22 ENCOUNTER — Telehealth: Payer: Self-pay | Admitting: Hematology

## 2016-12-22 ENCOUNTER — Encounter: Payer: Self-pay | Admitting: Hematology

## 2016-12-22 ENCOUNTER — Ambulatory Visit (HOSPITAL_BASED_OUTPATIENT_CLINIC_OR_DEPARTMENT_OTHER): Payer: 59

## 2016-12-22 VITALS — BP 119/78 | HR 92 | Temp 98.2°F | Resp 16 | Wt 167.8 lb

## 2016-12-22 DIAGNOSIS — E039 Hypothyroidism, unspecified: Secondary | ICD-10-CM | POA: Diagnosis not present

## 2016-12-22 DIAGNOSIS — D510 Vitamin B12 deficiency anemia due to intrinsic factor deficiency: Secondary | ICD-10-CM | POA: Diagnosis not present

## 2016-12-22 DIAGNOSIS — Z7289 Other problems related to lifestyle: Secondary | ICD-10-CM | POA: Diagnosis not present

## 2016-12-22 DIAGNOSIS — E538 Deficiency of other specified B group vitamins: Secondary | ICD-10-CM

## 2016-12-22 DIAGNOSIS — D7589 Other specified diseases of blood and blood-forming organs: Secondary | ICD-10-CM

## 2016-12-22 LAB — CBC & DIFF AND RETIC
BASO%: 0.6 % (ref 0.0–2.0)
BASOS ABS: 0 10*3/uL (ref 0.0–0.1)
EOS ABS: 0.3 10*3/uL (ref 0.0–0.5)
EOS%: 4.9 % (ref 0.0–7.0)
HEMATOCRIT: 45.1 % (ref 34.8–46.6)
HEMOGLOBIN: 14.7 g/dL (ref 11.6–15.9)
IMMATURE RETIC FRACT: 7.6 % (ref 1.60–10.00)
LYMPH#: 1.4 10*3/uL (ref 0.9–3.3)
LYMPH%: 28.1 % (ref 14.0–49.7)
MCH: 31.7 pg (ref 25.1–34.0)
MCHC: 32.6 g/dL (ref 31.5–36.0)
MCV: 97.4 fL (ref 79.5–101.0)
MONO#: 0.4 10*3/uL (ref 0.1–0.9)
MONO%: 7.8 % (ref 0.0–14.0)
NEUT%: 58.6 % (ref 38.4–76.8)
NEUTROS ABS: 3 10*3/uL (ref 1.5–6.5)
Platelets: 206 10*3/uL (ref 145–400)
RBC: 4.63 10*6/uL (ref 3.70–5.45)
RDW: 12.3 % (ref 11.2–14.5)
RETIC %: 1.4 % (ref 0.70–2.10)
RETIC CT ABS: 64.82 10*3/uL (ref 33.70–90.70)
WBC: 5.1 10*3/uL (ref 3.9–10.3)

## 2016-12-22 NOTE — Telephone Encounter (Signed)
Per 5/8 LOS - f/u on as needed basis - no additional appts scheduled,.

## 2016-12-22 NOTE — Patient Instructions (Signed)
Thank you for choosing [facility] to provide your oncology and hematology care.  To afford each patient quality time with our providers, please arrive 30 minutes before your scheduled appointment time.  If you arrive late for your appointment, you may be asked to reschedule.  We strive to give you quality time with our providers, and arriving late affects you and other patients whose appointments are after yours.  If you are a no show for multiple scheduled visits, you may be dismissed from the clinic at the providers discretion.   Again, thank you for choosing [facility], our hope is that these requests will decrease the amount of time that you wait before being seen by our physicians.  ______________________________________________________________________ Should you have questions after your visit to the  Cancer Center, please contact our office at [phone] between the hours of 8:30 and 4:30 p.m.    Voicemails left after 4:30p.m will not be returned until the following business day.   For prescription refill requests, please have your pharmacy contact us directly.  Please also try to allow 48 hours for prescription requests.   Please contact the scheduling department for questions regarding scheduling.  For scheduling of procedures such as PET scans, CT scans, MRI, Ultrasound, etc please contact central scheduling at [phone].   Resources For Cancer Patients and Caregivers:  American Cancer Society:  800-227-2345  Can help patients locate various types of support and financial assistance Cancer Care: 1-800-813-HOPE (4673) Provides financial assistance, online support groups, medication/co-pay assistance.   [place] DSS:  336-641-3447 Where to apply for food stamps, Medicaid, and utility assistance Medicare Rights Center: 800-333-4114 Helps people with Medicare understand their rights and benefits, navigate the Medicare system, and secure the  quality healthcare they deserve SCAT: 336-333-6589 [employer] [employer]'s shared-ride transportation service for eligible riders who have a disability that prevents them from riding the fixed route bus.   For additional information on assistance programs please contact our social worker:   Grier Hock/Abigail Elmore:  [phone] 

## 2016-12-23 LAB — VITAMIN B12: VITAMIN B 12: 518 pg/mL (ref 232–1245)

## 2016-12-23 MED ORDER — B-12 1000 MCG SL SUBL: 1000.0000 ug | SUBLINGUAL_TABLET | Freq: Every day | SUBLINGUAL | 3 refills | Status: DC

## 2016-12-23 NOTE — Progress Notes (Signed)
Marland Kitchen    HEMATOLOGY/ONCOLOGY CLINIC NOTE  Date of Service: .12/22/2016  PCP: Juluis Rainier MD   CHIEF COMPLAINTS/PURPOSE OF CONSULTATION:  f/u Macrocytosis and pernicious anemia  HISTORY OF PRESENTING ILLNESS:   Kathryn Taylor is a wonderful 56 y.o. female who has been referred to [place] by Dr Juluis Rainier MD for evaluation and management of macrocytosis without significant anemia .  Patient has a history of hypothyroidism that is newly diagnosed for which she is on thyroid replacement . Her TSH levels showed suboptimal replacement and it appears that she was not taking her pills optimally . Her dose has been adjusted by her primary care physician . She was also recently noted to have a low vitamin B12 levels and a positive anti-intrinsic factor antibody suggestive of pernicious anemia .this could certainly be an association with her hypothyroidism as an autoimmune process . She has a diagnosis of mixed connective tissue disorder and sees Dr. Nickola Major. She is currently on Plaquenil for this .   Notes that she used to drink about 3-5 glasses of wine daily at least 5 days a week . Discussed that this is somewhat excessive and notes that she will try to cut down her wine use .  She had labs with her primary care physician on 08/25/2016 that showed a CBC with normal hemoglobin of 13.6 but with an MCV of 121 normal RDW of 14.7, normal WBC count of 8.4k. Her TSH at the time was 12.7. Positive anti parietal cell and anti-intrinsic factor antibody.  INTERVAL HISTORY  Patient is here for f/u of her pernicious anemia and RBC macrocytosis. She notes that she has significant reduced her ETOH intake and has been taking her B12 and levothyroxine appropriately. She notes that she is feeling well and has no acute new concerns. Her RBC macrocytosis has resolved and her Hgb is stable.  MEDICAL HISTORY:  Past Medical History:  Diagnosis Date  . Connective tissue disease (HCC)   . Depression   . History of  pulmonary embolus (PE)   . Hypothyroidism   . PE (pulmonary embolism) 2006  . Thyroid disease   High-risk alcohol use   GERD Hiatal hernia Pernicious anemia History of pulmonary embolism in 2006 when she was on hormonal pills.   SURGICAL HISTORY: Past Surgical History:  Procedure Laterality Date  . ABDOMINAL HYSTERECTOMY    Tonsillectomy  TMJ surgery  SOCIAL HISTORY: Social History   Social History  . Marital status: Married    Spouse name: N/A  . Number of children: N/A  . Years of education: N/A   Occupational History  . Not on file.   Social History Main Topics  . Smoking status: Never Smoker  . Smokeless tobacco: Never Used  . Alcohol use Yes     Comment: 3-5x per week  . Drug use: Unknown  . Sexual activity: Not on file   Other Topics Concern  . Not on file   Social History Narrative  . No narrative on file  Note significantly history of exposure to secondhand smoke Drinks wine 3-5 drinks daily 5 days a week is trying to cut down.   FAMILY HISTORY: History reviewed. No pertinent family history.  ALLERGIES:  is allergic to erythromycin; morphine and related; penicillins; and sulfa antibiotics.  MEDICATIONS:  Current Outpatient Prescriptions  Medication Sig Dispense Refill  . citalopram (CELEXA) 20 MG tablet Take 20 mg by mouth daily.  0  . fluticasone (FLONASE) 50 MCG/ACT nasal spray Place 2 sprays into the nose  daily as needed for allergies.     . hydroxychloroquine (PLAQUENIL) 200 MG tablet TAKE 1 TABLET BY MOUTH WITH FOOD OR MILK TWICE A DAY  2  . levothyroxine (SYNTHROID, LEVOTHROID) 125 MCG tablet Take 125 mcg by mouth daily.  0  . meloxicam (MOBIC) 15 MG tablet Take 15 mg by mouth daily.    . Multiple Vitamins-Minerals (MULTIVITAMIN ADULT PO) Take 1 tablet by mouth daily.    . traZODone (DESYREL) 100 MG tablet Take 100 mg by mouth at bedtime.  1   No current facility-administered medications for this visit.     REVIEW OF SYSTEMS:    10  Point review of Systems was done is negative except as noted above.  PHYSICAL EXAMINATION: ECOG PERFORMANCE STATUS: 1 - Symptomatic but completely ambulatory  . Vitals:   12/22/16 1052  BP: 119/78  Pulse: 92  Resp: 16  Temp: 98.2 F (36.8 C)   Filed Weights   12/22/16 1052  Weight: 167 lb 12.8 oz (76.1 kg)   .Body mass index is 28.8 kg/m.  GENERAL:alert, in no acute distress and comfortable SKIN: skin color, texture, turgor are normal, no rashes or significant lesions EYES: normal, conjunctiva are pink and non-injected, sclera clear OROPHARYNX:no exudate, no erythema and lips, buccal mucosa, and tongue normal  NECK: supple, no JVD, thyroid normal size, non-tender, without nodularity LYMPH:  no palpable lymphadenopathy in the cervical, axillary or inguinal LUNGS: clear to auscultation with normal respiratory effort HEART: regular rate & rhythm,  no murmurs and no lower extremity edema ABDOMEN: abdomen soft, non-tender, normoactive bowel sounds , No palpable hepatosplenomegaly  Musculoskeletal: no cyanosis of digits and no clubbing  PSYCH: alert & oriented x 3 with fluent speech NEURO: no focal motor/sensory deficits  LABORATORY DATA:  I have reviewed the data as listed  . CBC Latest Ref Rng & Units 12/22/2016 09/24/2016 09/24/2016  WBC 3.9 - 10.3 10e3/uL 5.1 4.3 -  Hemoglobin 11.6 - 15.9 g/dL 30.814.7 65.714.1 -  Hematocrit 34.8 - 46.6 % 45.1 41.2 41.0  Platelets 145 - 400 10e3/uL 206 224 -   . CBC    Component Value Date/Time   WBC 5.1 12/22/2016 1040   RBC 4.63 12/22/2016 1040   HGB 14.7 12/22/2016 1040   HCT 45.1 12/22/2016 1040   PLT 206 12/22/2016 1040   MCV 97.4 12/22/2016 1040   MCH 31.7 12/22/2016 1040   MCHC 32.6 12/22/2016 1040   RDW 12.3 12/22/2016 1040   LYMPHSABS 1.4 12/22/2016 1040   MONOABS 0.4 12/22/2016 1040   EOSABS 0.3 12/22/2016 1040   BASOSABS 0.0 12/22/2016 1040    . CMP Latest Ref Rng & Units 09/24/2016 09/24/2016 11/26/2005  Glucose 70 - 140 mg/dl  91 - 85  BUN 7.0 - 84.626.0 mg/dL 96.216.4 - 12  Creatinine 0.6 - 1.1 mg/dL 0.9 - 0.9  Sodium 952136 - 145 mEq/L 143 - 140  Potassium 3.5 - 5.1 mEq/L 4.3 - 4.2  Chloride 96 - 112 mEq/L - - 106  CO2 22 - 29 mEq/L 29 - 26  Calcium 8.4 - 10.4 mg/dL 9.9 - 9.4  Total Protein 6.0 - 8.5 g/dL 7.1 6.8 7.2  Total Bilirubin 0.20 - 1.20 mg/dL 8.410.83 - 0.4  Alkaline Phos 40 - 150 U/L 63 - 46  AST 5 - 34 U/L 23 - 25  ALT 0 - 55 U/L 22 - 13   Component     Latest Ref Rng & Units 09/24/2016  Folate, Hemolysate  Not Estab. ng/mL 570.0  HCT     34.0 - 46.6 % 41.0  Folate, RBC     >498 ng/mL 1,390  Vitamin B12     232 - 1,245 pg/mL 204 (L)  Intrinsic Factor Abs, Serum     0.0 - 1.1 AU/mL 7.7 (H)  Parietal Cell Ab     0.0 - 20.0 Units 42.6 (H)     RADIOGRAPHIC STUDIES: I have personally reviewed the radiological images as listed and agreed with the findings in the report. No results found.  ASSESSMENT & PLAN:   56 year old female with   1) Macrocytosis without significant anemia - now resolved with decreased ETOH, appropriate replacement of levothyroxine and B12   this appears to be primarily related to her hypothyroidism and excess alcohol use . 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 3) B12 deficiency due to pernicious anemia  4) Hypothyroidism PLAN -counseled on minimizing ETOH use -continue to Optimize treatment of hypothyroidism as per primary care physician . Was counseled on optimal intake of levothyroxine on an empty stomach without any other medications or food. Taylor thing in the morning . -continue sublingual liquid vitamin B12 1000 micrograms daily . -awaiting B12 levels from today. If adequate will continue current plan. In the unlikely situation that she is still deficient  would need to start B12 [place] q4weeks. -Cannot rule out other associated B vitamin deficiencies . Recommended she take 1 tablet of B complex daily . -Monitor for iron deficiency going forward. -Would also recommend her primary care physician check for vitamin D deficiency and replace that appropriately. -Continue follow-up with primary care physician for management of other medical comorbidities.  Continue further f/u with PCP -- would recommend rpt B12 and ferritin levels in 3-4 months and adjust replacement accordingly.  RTC with Dr Candise Che on any as needed basis if any new questions or concerns arise  All of the patients questions were answered with apparent satisfaction. The patient knows to call the clinic with any problems, questions or concerns.  I spent 20 minutes counseling the patient face to face. The total time spent in the appointment was 25 minutes and more than 50% was on counseling and direct patient cares.    Wyvonnia Lora MD MS AAHIVMS [facility] Hematology/Oncology Physician [facility]  (Office):       [phone] (Work cell):  [phone] (Fax):           [phone]

## 2016-12-28 ENCOUNTER — Telehealth: Payer: Self-pay | Admitting: *Deleted

## 2016-12-28 NOTE — Telephone Encounter (Signed)
Call from pt requesting B12 result. Informed of of value 518. Pt takes 1,200 mcg sublingually every day. Asks if she should change anything based on this result. MD mentioned during visit he'd like level to be closer to 1,000. Message to MD for review.

## 2016-12-31 ENCOUNTER — Telehealth: Payer: Self-pay | Admitting: *Deleted

## 2016-12-31 ENCOUNTER — Encounter: Payer: Self-pay | Admitting: Hematology

## 2016-12-31 NOTE — Telephone Encounter (Signed)
-----   Message from Johney MaineGautam Kishore Kale, MD sent at 12/30/2016  7:01 PM EDT ----- Derenda MisLashonya, Plz let Ms Cornelia Coparatto know that her B12 levels have improved to 518 -- continue B12 replacement as she is doing currently. thx GK

## 2016-12-31 NOTE — Telephone Encounter (Signed)
Informed patient of information below. Patient verbalized understanding.

## 2016-12-31 NOTE — Telephone Encounter (Signed)
Left voicemail asking patient to call [facility] back.

## 2017-02-19 DIAGNOSIS — M25562 Pain in left knee: Secondary | ICD-10-CM | POA: Diagnosis not present

## 2017-02-22 ENCOUNTER — Other Ambulatory Visit: Payer: Self-pay | Admitting: Obstetrics and Gynecology

## 2017-02-22 DIAGNOSIS — Z01419 Encounter for gynecological examination (general) (routine) without abnormal findings: Secondary | ICD-10-CM | POA: Diagnosis not present

## 2017-02-22 DIAGNOSIS — Z6828 Body mass index (BMI) 28.0-28.9, adult: Secondary | ICD-10-CM | POA: Diagnosis not present

## 2017-02-22 DIAGNOSIS — N644 Mastodynia: Secondary | ICD-10-CM | POA: Diagnosis not present

## 2017-02-22 DIAGNOSIS — N6320 Unspecified lump in the left breast, unspecified quadrant: Secondary | ICD-10-CM

## 2017-02-24 DIAGNOSIS — J3089 Other allergic rhinitis: Secondary | ICD-10-CM | POA: Diagnosis not present

## 2017-02-24 DIAGNOSIS — J301 Allergic rhinitis due to pollen: Secondary | ICD-10-CM | POA: Diagnosis not present

## 2017-02-26 ENCOUNTER — Other Ambulatory Visit: Payer: 59

## 2017-03-02 ENCOUNTER — Ambulatory Visit
Admission: RE | Admit: 2017-03-02 | Discharge: 2017-03-02 | Disposition: A | Discharge status: Home / Self Care | Payer: BLUE CROSS/BLUE SHIELD | Source: Ambulatory Visit | Attending: Obstetrics and Gynecology | Admitting: Obstetrics and Gynecology

## 2017-03-02 DIAGNOSIS — R922 Inconclusive mammogram: Secondary | ICD-10-CM | POA: Diagnosis not present

## 2017-03-02 DIAGNOSIS — N6320 Unspecified lump in the left breast, unspecified quadrant: Secondary | ICD-10-CM

## 2017-03-24 DIAGNOSIS — J3089 Other allergic rhinitis: Secondary | ICD-10-CM | POA: Diagnosis not present

## 2017-03-24 DIAGNOSIS — J301 Allergic rhinitis due to pollen: Secondary | ICD-10-CM | POA: Diagnosis not present

## 2017-03-31 DIAGNOSIS — J301 Allergic rhinitis due to pollen: Secondary | ICD-10-CM | POA: Diagnosis not present

## 2017-03-31 DIAGNOSIS — J3089 Other allergic rhinitis: Secondary | ICD-10-CM | POA: Diagnosis not present

## 2017-04-06 DIAGNOSIS — J301 Allergic rhinitis due to pollen: Secondary | ICD-10-CM | POA: Diagnosis not present

## 2017-04-06 DIAGNOSIS — J3089 Other allergic rhinitis: Secondary | ICD-10-CM | POA: Diagnosis not present

## 2017-04-08 DIAGNOSIS — J301 Allergic rhinitis due to pollen: Secondary | ICD-10-CM | POA: Diagnosis not present

## 2017-04-08 DIAGNOSIS — J45990 Exercise induced bronchospasm: Secondary | ICD-10-CM | POA: Diagnosis not present

## 2017-04-08 DIAGNOSIS — J3089 Other allergic rhinitis: Secondary | ICD-10-CM | POA: Diagnosis not present

## 2017-04-12 DIAGNOSIS — J301 Allergic rhinitis due to pollen: Secondary | ICD-10-CM | POA: Diagnosis not present

## 2017-04-12 DIAGNOSIS — J3089 Other allergic rhinitis: Secondary | ICD-10-CM | POA: Diagnosis not present

## 2017-04-13 DIAGNOSIS — J3089 Other allergic rhinitis: Secondary | ICD-10-CM | POA: Diagnosis not present

## 2017-04-22 DIAGNOSIS — J3089 Other allergic rhinitis: Secondary | ICD-10-CM | POA: Diagnosis not present

## 2017-04-22 DIAGNOSIS — J301 Allergic rhinitis due to pollen: Secondary | ICD-10-CM | POA: Diagnosis not present

## 2017-04-22 DIAGNOSIS — J3081 Allergic rhinitis due to animal (cat) (dog) hair and dander: Secondary | ICD-10-CM | POA: Diagnosis not present

## 2017-04-28 DIAGNOSIS — J3089 Other allergic rhinitis: Secondary | ICD-10-CM | POA: Diagnosis not present

## 2017-04-28 DIAGNOSIS — J301 Allergic rhinitis due to pollen: Secondary | ICD-10-CM | POA: Diagnosis not present

## 2017-05-05 DIAGNOSIS — J3081 Allergic rhinitis due to animal (cat) (dog) hair and dander: Secondary | ICD-10-CM | POA: Diagnosis not present

## 2017-05-05 DIAGNOSIS — J301 Allergic rhinitis due to pollen: Secondary | ICD-10-CM | POA: Diagnosis not present

## 2017-05-05 DIAGNOSIS — J3089 Other allergic rhinitis: Secondary | ICD-10-CM | POA: Diagnosis not present

## 2017-05-11 DIAGNOSIS — J3089 Other allergic rhinitis: Secondary | ICD-10-CM | POA: Diagnosis not present

## 2017-05-11 DIAGNOSIS — J301 Allergic rhinitis due to pollen: Secondary | ICD-10-CM | POA: Diagnosis not present

## 2017-05-14 DIAGNOSIS — Z9689 Presence of other specified functional implants: Secondary | ICD-10-CM | POA: Diagnosis not present

## 2017-05-14 DIAGNOSIS — M25562 Pain in left knee: Secondary | ICD-10-CM | POA: Diagnosis not present

## 2017-05-14 DIAGNOSIS — M25571 Pain in right ankle and joints of right foot: Secondary | ICD-10-CM | POA: Diagnosis not present

## 2017-05-14 DIAGNOSIS — M25561 Pain in right knee: Secondary | ICD-10-CM | POA: Diagnosis not present

## 2017-05-20 DIAGNOSIS — J301 Allergic rhinitis due to pollen: Secondary | ICD-10-CM | POA: Diagnosis not present

## 2017-05-20 DIAGNOSIS — J3089 Other allergic rhinitis: Secondary | ICD-10-CM | POA: Diagnosis not present

## 2017-05-21 DIAGNOSIS — J301 Allergic rhinitis due to pollen: Secondary | ICD-10-CM | POA: Diagnosis not present

## 2017-05-27 DIAGNOSIS — Z23 Encounter for immunization: Secondary | ICD-10-CM | POA: Diagnosis not present

## 2017-05-27 DIAGNOSIS — R21 Rash and other nonspecific skin eruption: Secondary | ICD-10-CM | POA: Diagnosis not present

## 2017-06-17 DIAGNOSIS — J301 Allergic rhinitis due to pollen: Secondary | ICD-10-CM | POA: Diagnosis not present

## 2017-06-17 DIAGNOSIS — J3089 Other allergic rhinitis: Secondary | ICD-10-CM | POA: Diagnosis not present

## 2017-06-23 DIAGNOSIS — J301 Allergic rhinitis due to pollen: Secondary | ICD-10-CM | POA: Diagnosis not present

## 2017-06-23 DIAGNOSIS — J3089 Other allergic rhinitis: Secondary | ICD-10-CM | POA: Diagnosis not present

## 2017-07-14 DIAGNOSIS — M2550 Pain in unspecified joint: Secondary | ICD-10-CM | POA: Diagnosis not present

## 2017-07-14 DIAGNOSIS — M359 Systemic involvement of connective tissue, unspecified: Secondary | ICD-10-CM | POA: Diagnosis not present

## 2017-07-14 DIAGNOSIS — M064 Inflammatory polyarthropathy: Secondary | ICD-10-CM | POA: Diagnosis not present

## 2017-07-14 DIAGNOSIS — L932 Other local lupus erythematosus: Secondary | ICD-10-CM | POA: Diagnosis not present

## 2017-07-16 DIAGNOSIS — J301 Allergic rhinitis due to pollen: Secondary | ICD-10-CM | POA: Diagnosis not present

## 2017-07-16 DIAGNOSIS — J3089 Other allergic rhinitis: Secondary | ICD-10-CM | POA: Diagnosis not present

## 2017-07-22 DIAGNOSIS — J3089 Other allergic rhinitis: Secondary | ICD-10-CM | POA: Diagnosis not present

## 2017-07-22 DIAGNOSIS — J3081 Allergic rhinitis due to animal (cat) (dog) hair and dander: Secondary | ICD-10-CM | POA: Diagnosis not present

## 2017-07-22 DIAGNOSIS — J301 Allergic rhinitis due to pollen: Secondary | ICD-10-CM | POA: Diagnosis not present

## 2017-07-28 DIAGNOSIS — J3089 Other allergic rhinitis: Secondary | ICD-10-CM | POA: Diagnosis not present

## 2017-07-28 DIAGNOSIS — J301 Allergic rhinitis due to pollen: Secondary | ICD-10-CM | POA: Diagnosis not present

## 2017-07-29 DIAGNOSIS — L814 Other melanin hyperpigmentation: Secondary | ICD-10-CM | POA: Diagnosis not present

## 2017-07-29 DIAGNOSIS — D1800 Hemangioma unspecified site: Secondary | ICD-10-CM | POA: Diagnosis not present

## 2017-07-29 DIAGNOSIS — L821 Other seborrheic keratosis: Secondary | ICD-10-CM | POA: Diagnosis not present

## 2017-07-29 DIAGNOSIS — D225 Melanocytic nevi of trunk: Secondary | ICD-10-CM | POA: Diagnosis not present

## 2017-08-04 DIAGNOSIS — J3089 Other allergic rhinitis: Secondary | ICD-10-CM | POA: Diagnosis not present

## 2017-08-04 DIAGNOSIS — J301 Allergic rhinitis due to pollen: Secondary | ICD-10-CM | POA: Diagnosis not present

## 2017-08-25 DIAGNOSIS — J301 Allergic rhinitis due to pollen: Secondary | ICD-10-CM | POA: Diagnosis not present

## 2017-08-25 DIAGNOSIS — J3089 Other allergic rhinitis: Secondary | ICD-10-CM | POA: Diagnosis not present

## 2017-09-15 DIAGNOSIS — J301 Allergic rhinitis due to pollen: Secondary | ICD-10-CM | POA: Diagnosis not present

## 2017-09-15 DIAGNOSIS — J3089 Other allergic rhinitis: Secondary | ICD-10-CM | POA: Diagnosis not present

## 2017-09-22 DIAGNOSIS — J3089 Other allergic rhinitis: Secondary | ICD-10-CM | POA: Diagnosis not present

## 2017-09-22 DIAGNOSIS — J301 Allergic rhinitis due to pollen: Secondary | ICD-10-CM | POA: Diagnosis not present

## 2017-09-23 DIAGNOSIS — E538 Deficiency of other specified B group vitamins: Secondary | ICD-10-CM | POA: Diagnosis not present

## 2017-09-23 DIAGNOSIS — E039 Hypothyroidism, unspecified: Secondary | ICD-10-CM | POA: Diagnosis not present

## 2017-09-23 DIAGNOSIS — Z7189 Other specified counseling: Secondary | ICD-10-CM | POA: Diagnosis not present

## 2017-09-23 DIAGNOSIS — Z23 Encounter for immunization: Secondary | ICD-10-CM | POA: Diagnosis not present

## 2017-10-06 DIAGNOSIS — J3089 Other allergic rhinitis: Secondary | ICD-10-CM | POA: Diagnosis not present

## 2017-10-06 DIAGNOSIS — J301 Allergic rhinitis due to pollen: Secondary | ICD-10-CM | POA: Diagnosis not present

## 2017-10-27 DIAGNOSIS — J301 Allergic rhinitis due to pollen: Secondary | ICD-10-CM | POA: Diagnosis not present

## 2017-10-27 DIAGNOSIS — J3089 Other allergic rhinitis: Secondary | ICD-10-CM | POA: Diagnosis not present

## 2017-11-03 DIAGNOSIS — J3089 Other allergic rhinitis: Secondary | ICD-10-CM | POA: Diagnosis not present

## 2017-11-03 DIAGNOSIS — J301 Allergic rhinitis due to pollen: Secondary | ICD-10-CM | POA: Diagnosis not present

## 2017-11-17 DIAGNOSIS — J3089 Other allergic rhinitis: Secondary | ICD-10-CM | POA: Diagnosis not present

## 2017-11-17 DIAGNOSIS — J301 Allergic rhinitis due to pollen: Secondary | ICD-10-CM | POA: Diagnosis not present

## 2017-11-25 DIAGNOSIS — J301 Allergic rhinitis due to pollen: Secondary | ICD-10-CM | POA: Diagnosis not present

## 2017-11-25 DIAGNOSIS — J3089 Other allergic rhinitis: Secondary | ICD-10-CM | POA: Diagnosis not present

## 2017-12-01 DIAGNOSIS — J3089 Other allergic rhinitis: Secondary | ICD-10-CM | POA: Diagnosis not present

## 2017-12-01 DIAGNOSIS — J301 Allergic rhinitis due to pollen: Secondary | ICD-10-CM | POA: Diagnosis not present

## 2017-12-08 DIAGNOSIS — J301 Allergic rhinitis due to pollen: Secondary | ICD-10-CM | POA: Diagnosis not present

## 2017-12-08 DIAGNOSIS — J3089 Other allergic rhinitis: Secondary | ICD-10-CM | POA: Diagnosis not present

## 2017-12-15 DIAGNOSIS — J301 Allergic rhinitis due to pollen: Secondary | ICD-10-CM | POA: Diagnosis not present

## 2017-12-15 DIAGNOSIS — J3089 Other allergic rhinitis: Secondary | ICD-10-CM | POA: Diagnosis not present

## 2017-12-29 DIAGNOSIS — J3089 Other allergic rhinitis: Secondary | ICD-10-CM | POA: Diagnosis not present

## 2017-12-29 DIAGNOSIS — J301 Allergic rhinitis due to pollen: Secondary | ICD-10-CM | POA: Diagnosis not present

## 2017-12-30 DIAGNOSIS — J3089 Other allergic rhinitis: Secondary | ICD-10-CM | POA: Diagnosis not present

## 2018-01-06 DIAGNOSIS — J3089 Other allergic rhinitis: Secondary | ICD-10-CM | POA: Diagnosis not present

## 2018-01-06 DIAGNOSIS — J301 Allergic rhinitis due to pollen: Secondary | ICD-10-CM | POA: Diagnosis not present

## 2018-01-13 DIAGNOSIS — J3089 Other allergic rhinitis: Secondary | ICD-10-CM | POA: Diagnosis not present

## 2018-01-13 DIAGNOSIS — J301 Allergic rhinitis due to pollen: Secondary | ICD-10-CM | POA: Diagnosis not present

## 2018-01-17 DIAGNOSIS — M359 Systemic involvement of connective tissue, unspecified: Secondary | ICD-10-CM | POA: Diagnosis not present

## 2018-01-17 DIAGNOSIS — E538 Deficiency of other specified B group vitamins: Secondary | ICD-10-CM | POA: Diagnosis not present

## 2018-01-17 DIAGNOSIS — M064 Inflammatory polyarthropathy: Secondary | ICD-10-CM | POA: Diagnosis not present

## 2018-01-17 DIAGNOSIS — M2550 Pain in unspecified joint: Secondary | ICD-10-CM | POA: Diagnosis not present

## 2018-01-17 DIAGNOSIS — L932 Other local lupus erythematosus: Secondary | ICD-10-CM | POA: Diagnosis not present

## 2018-02-02 DIAGNOSIS — J3089 Other allergic rhinitis: Secondary | ICD-10-CM | POA: Diagnosis not present

## 2018-02-02 DIAGNOSIS — J301 Allergic rhinitis due to pollen: Secondary | ICD-10-CM | POA: Diagnosis not present

## 2018-02-04 DIAGNOSIS — J3089 Other allergic rhinitis: Secondary | ICD-10-CM | POA: Diagnosis not present

## 2018-02-04 DIAGNOSIS — J301 Allergic rhinitis due to pollen: Secondary | ICD-10-CM | POA: Diagnosis not present

## 2018-02-07 DIAGNOSIS — H524 Presbyopia: Secondary | ICD-10-CM | POA: Diagnosis not present

## 2018-02-11 DIAGNOSIS — J3089 Other allergic rhinitis: Secondary | ICD-10-CM | POA: Diagnosis not present

## 2018-02-11 DIAGNOSIS — J301 Allergic rhinitis due to pollen: Secondary | ICD-10-CM | POA: Diagnosis not present

## 2018-02-21 DIAGNOSIS — J301 Allergic rhinitis due to pollen: Secondary | ICD-10-CM | POA: Diagnosis not present

## 2018-02-21 DIAGNOSIS — J3089 Other allergic rhinitis: Secondary | ICD-10-CM | POA: Diagnosis not present

## 2018-02-23 DIAGNOSIS — J301 Allergic rhinitis due to pollen: Secondary | ICD-10-CM | POA: Diagnosis not present

## 2018-02-23 DIAGNOSIS — Z79899 Other long term (current) drug therapy: Secondary | ICD-10-CM | POA: Diagnosis not present

## 2018-02-23 DIAGNOSIS — J3089 Other allergic rhinitis: Secondary | ICD-10-CM | POA: Diagnosis not present

## 2018-03-08 DIAGNOSIS — J301 Allergic rhinitis due to pollen: Secondary | ICD-10-CM | POA: Diagnosis not present

## 2018-03-08 DIAGNOSIS — J3089 Other allergic rhinitis: Secondary | ICD-10-CM | POA: Diagnosis not present

## 2018-03-09 DIAGNOSIS — Z1212 Encounter for screening for malignant neoplasm of rectum: Secondary | ICD-10-CM | POA: Diagnosis not present

## 2018-03-09 DIAGNOSIS — Z13820 Encounter for screening for osteoporosis: Secondary | ICD-10-CM | POA: Diagnosis not present

## 2018-03-09 DIAGNOSIS — Z01419 Encounter for gynecological examination (general) (routine) without abnormal findings: Secondary | ICD-10-CM | POA: Diagnosis not present

## 2018-03-09 DIAGNOSIS — Z1231 Encounter for screening mammogram for malignant neoplasm of breast: Secondary | ICD-10-CM | POA: Diagnosis not present

## 2018-03-09 DIAGNOSIS — Z6828 Body mass index (BMI) 28.0-28.9, adult: Secondary | ICD-10-CM | POA: Diagnosis not present

## 2018-03-16 DIAGNOSIS — J301 Allergic rhinitis due to pollen: Secondary | ICD-10-CM | POA: Diagnosis not present

## 2018-03-16 DIAGNOSIS — J3089 Other allergic rhinitis: Secondary | ICD-10-CM | POA: Diagnosis not present

## 2018-03-22 DIAGNOSIS — J301 Allergic rhinitis due to pollen: Secondary | ICD-10-CM | POA: Diagnosis not present

## 2018-03-22 DIAGNOSIS — J3089 Other allergic rhinitis: Secondary | ICD-10-CM | POA: Diagnosis not present

## 2018-03-30 DIAGNOSIS — J3089 Other allergic rhinitis: Secondary | ICD-10-CM | POA: Diagnosis not present

## 2018-03-30 DIAGNOSIS — J301 Allergic rhinitis due to pollen: Secondary | ICD-10-CM | POA: Diagnosis not present

## 2018-04-11 DIAGNOSIS — J301 Allergic rhinitis due to pollen: Secondary | ICD-10-CM | POA: Diagnosis not present

## 2018-04-11 DIAGNOSIS — J3089 Other allergic rhinitis: Secondary | ICD-10-CM | POA: Diagnosis not present

## 2018-04-13 DIAGNOSIS — J3089 Other allergic rhinitis: Secondary | ICD-10-CM | POA: Diagnosis not present

## 2018-04-13 DIAGNOSIS — J45990 Exercise induced bronchospasm: Secondary | ICD-10-CM | POA: Diagnosis not present

## 2018-04-13 DIAGNOSIS — J301 Allergic rhinitis due to pollen: Secondary | ICD-10-CM | POA: Diagnosis not present

## 2018-05-02 DIAGNOSIS — L820 Inflamed seborrheic keratosis: Secondary | ICD-10-CM | POA: Diagnosis not present

## 2018-05-03 DIAGNOSIS — J3089 Other allergic rhinitis: Secondary | ICD-10-CM | POA: Diagnosis not present

## 2018-05-03 DIAGNOSIS — J301 Allergic rhinitis due to pollen: Secondary | ICD-10-CM | POA: Diagnosis not present

## 2018-05-11 DIAGNOSIS — J3089 Other allergic rhinitis: Secondary | ICD-10-CM | POA: Diagnosis not present

## 2018-05-11 DIAGNOSIS — J301 Allergic rhinitis due to pollen: Secondary | ICD-10-CM | POA: Diagnosis not present

## 2018-05-23 DIAGNOSIS — J301 Allergic rhinitis due to pollen: Secondary | ICD-10-CM | POA: Diagnosis not present

## 2018-05-23 DIAGNOSIS — J3089 Other allergic rhinitis: Secondary | ICD-10-CM | POA: Diagnosis not present

## 2018-06-01 DIAGNOSIS — J3089 Other allergic rhinitis: Secondary | ICD-10-CM | POA: Diagnosis not present

## 2018-06-01 DIAGNOSIS — J301 Allergic rhinitis due to pollen: Secondary | ICD-10-CM | POA: Diagnosis not present

## 2018-06-17 DIAGNOSIS — J3089 Other allergic rhinitis: Secondary | ICD-10-CM | POA: Diagnosis not present

## 2018-06-17 DIAGNOSIS — J301 Allergic rhinitis due to pollen: Secondary | ICD-10-CM | POA: Diagnosis not present

## 2018-06-29 DIAGNOSIS — J3089 Other allergic rhinitis: Secondary | ICD-10-CM | POA: Diagnosis not present

## 2018-06-29 DIAGNOSIS — J301 Allergic rhinitis due to pollen: Secondary | ICD-10-CM | POA: Diagnosis not present

## 2018-07-07 DIAGNOSIS — J301 Allergic rhinitis due to pollen: Secondary | ICD-10-CM | POA: Diagnosis not present

## 2018-07-07 DIAGNOSIS — J3089 Other allergic rhinitis: Secondary | ICD-10-CM | POA: Diagnosis not present

## 2018-07-22 DIAGNOSIS — J301 Allergic rhinitis due to pollen: Secondary | ICD-10-CM | POA: Diagnosis not present

## 2018-07-22 DIAGNOSIS — J3089 Other allergic rhinitis: Secondary | ICD-10-CM | POA: Diagnosis not present

## 2018-08-24 DIAGNOSIS — J019 Acute sinusitis, unspecified: Secondary | ICD-10-CM | POA: Diagnosis not present

## 2018-08-24 DIAGNOSIS — Z760 Encounter for issue of repeat prescription: Secondary | ICD-10-CM | POA: Diagnosis not present

## 2018-08-24 DIAGNOSIS — R05 Cough: Secondary | ICD-10-CM | POA: Diagnosis not present

## 2018-08-31 DIAGNOSIS — J209 Acute bronchitis, unspecified: Secondary | ICD-10-CM | POA: Diagnosis not present

## 2018-09-13 DIAGNOSIS — L932 Other local lupus erythematosus: Secondary | ICD-10-CM | POA: Diagnosis not present

## 2018-09-13 DIAGNOSIS — Z79899 Other long term (current) drug therapy: Secondary | ICD-10-CM | POA: Diagnosis not present

## 2018-09-13 DIAGNOSIS — M359 Systemic involvement of connective tissue, unspecified: Secondary | ICD-10-CM | POA: Diagnosis not present

## 2018-09-13 DIAGNOSIS — R5383 Other fatigue: Secondary | ICD-10-CM | POA: Diagnosis not present

## 2018-09-13 DIAGNOSIS — E538 Deficiency of other specified B group vitamins: Secondary | ICD-10-CM | POA: Diagnosis not present

## 2018-09-13 DIAGNOSIS — M064 Inflammatory polyarthropathy: Secondary | ICD-10-CM | POA: Diagnosis not present

## 2018-09-13 DIAGNOSIS — J301 Allergic rhinitis due to pollen: Secondary | ICD-10-CM | POA: Diagnosis not present

## 2018-09-13 DIAGNOSIS — J3089 Other allergic rhinitis: Secondary | ICD-10-CM | POA: Diagnosis not present

## 2018-09-28 DIAGNOSIS — J301 Allergic rhinitis due to pollen: Secondary | ICD-10-CM | POA: Diagnosis not present

## 2018-09-28 DIAGNOSIS — J3089 Other allergic rhinitis: Secondary | ICD-10-CM | POA: Diagnosis not present

## 2018-10-04 DIAGNOSIS — J301 Allergic rhinitis due to pollen: Secondary | ICD-10-CM | POA: Diagnosis not present

## 2018-10-04 DIAGNOSIS — J3089 Other allergic rhinitis: Secondary | ICD-10-CM | POA: Diagnosis not present

## 2018-10-11 DIAGNOSIS — J3089 Other allergic rhinitis: Secondary | ICD-10-CM | POA: Diagnosis not present

## 2018-10-11 DIAGNOSIS — J301 Allergic rhinitis due to pollen: Secondary | ICD-10-CM | POA: Diagnosis not present

## 2018-10-13 DIAGNOSIS — J3081 Allergic rhinitis due to animal (cat) (dog) hair and dander: Secondary | ICD-10-CM | POA: Diagnosis not present

## 2018-10-13 DIAGNOSIS — J3089 Other allergic rhinitis: Secondary | ICD-10-CM | POA: Diagnosis not present

## 2018-10-13 DIAGNOSIS — J301 Allergic rhinitis due to pollen: Secondary | ICD-10-CM | POA: Diagnosis not present

## 2018-10-17 DIAGNOSIS — J301 Allergic rhinitis due to pollen: Secondary | ICD-10-CM | POA: Diagnosis not present

## 2018-10-17 DIAGNOSIS — J3089 Other allergic rhinitis: Secondary | ICD-10-CM | POA: Diagnosis not present

## 2018-10-19 DIAGNOSIS — J3089 Other allergic rhinitis: Secondary | ICD-10-CM | POA: Diagnosis not present

## 2018-10-19 DIAGNOSIS — J301 Allergic rhinitis due to pollen: Secondary | ICD-10-CM | POA: Diagnosis not present

## 2018-10-26 DIAGNOSIS — J3089 Other allergic rhinitis: Secondary | ICD-10-CM | POA: Diagnosis not present

## 2018-10-26 DIAGNOSIS — J301 Allergic rhinitis due to pollen: Secondary | ICD-10-CM | POA: Diagnosis not present

## 2018-10-27 DIAGNOSIS — J301 Allergic rhinitis due to pollen: Secondary | ICD-10-CM | POA: Diagnosis not present

## 2018-10-28 DIAGNOSIS — J3089 Other allergic rhinitis: Secondary | ICD-10-CM | POA: Diagnosis not present

## 2018-10-31 DIAGNOSIS — J301 Allergic rhinitis due to pollen: Secondary | ICD-10-CM | POA: Diagnosis not present

## 2018-10-31 DIAGNOSIS — J3089 Other allergic rhinitis: Secondary | ICD-10-CM | POA: Diagnosis not present

## 2018-11-07 DIAGNOSIS — J3089 Other allergic rhinitis: Secondary | ICD-10-CM | POA: Diagnosis not present

## 2018-11-07 DIAGNOSIS — J301 Allergic rhinitis due to pollen: Secondary | ICD-10-CM | POA: Diagnosis not present

## 2018-11-15 DIAGNOSIS — J3089 Other allergic rhinitis: Secondary | ICD-10-CM | POA: Diagnosis not present

## 2018-11-15 DIAGNOSIS — J301 Allergic rhinitis due to pollen: Secondary | ICD-10-CM | POA: Diagnosis not present

## 2018-11-17 ENCOUNTER — Encounter (HOSPITAL_COMMUNITY): Payer: Self-pay

## 2018-11-17 ENCOUNTER — Emergency Department (HOSPITAL_COMMUNITY): Payer: BLUE CROSS/BLUE SHIELD

## 2018-11-17 ENCOUNTER — Other Ambulatory Visit: Payer: Self-pay

## 2018-11-17 ENCOUNTER — Emergency Department (HOSPITAL_COMMUNITY)
Admission: EM | Admit: 2018-11-17 | Discharge: 2018-11-17 | Disposition: A | Discharge status: Home / Self Care | Payer: BLUE CROSS/BLUE SHIELD | Attending: Emergency Medicine | Admitting: Emergency Medicine

## 2018-11-17 DIAGNOSIS — R9431 Abnormal electrocardiogram [ECG] [EKG]: Secondary | ICD-10-CM | POA: Diagnosis not present

## 2018-11-17 DIAGNOSIS — Z86711 Personal history of pulmonary embolism: Secondary | ICD-10-CM | POA: Insufficient documentation

## 2018-11-17 DIAGNOSIS — Z79899 Other long term (current) drug therapy: Secondary | ICD-10-CM | POA: Diagnosis not present

## 2018-11-17 DIAGNOSIS — R0602 Shortness of breath: Secondary | ICD-10-CM | POA: Diagnosis not present

## 2018-11-17 DIAGNOSIS — R079 Chest pain, unspecified: Secondary | ICD-10-CM | POA: Diagnosis not present

## 2018-11-17 DIAGNOSIS — E039 Hypothyroidism, unspecified: Secondary | ICD-10-CM | POA: Insufficient documentation

## 2018-11-17 DIAGNOSIS — Z880 Allergy status to penicillin: Secondary | ICD-10-CM | POA: Insufficient documentation

## 2018-11-17 DIAGNOSIS — R0789 Other chest pain: Secondary | ICD-10-CM | POA: Diagnosis not present

## 2018-11-17 DIAGNOSIS — R42 Dizziness and giddiness: Secondary | ICD-10-CM | POA: Diagnosis not present

## 2018-11-17 LAB — CBC WITH DIFFERENTIAL/PLATELET
Abs Immature Granulocytes: 0.01 10*3/uL (ref 0.00–0.07)
Basophils Absolute: 0 10*3/uL (ref 0.0–0.1)
Basophils Relative: 1 %
Eosinophils Absolute: 0.2 10*3/uL (ref 0.0–0.5)
Eosinophils Relative: 3 %
HCT: 43.6 % (ref 36.0–46.0)
Hemoglobin: 13.9 g/dL (ref 12.0–15.0)
Immature Granulocytes: 0 %
Lymphocytes Relative: 29 %
Lymphs Abs: 1.5 10*3/uL (ref 0.7–4.0)
MCH: 30.8 pg (ref 26.0–34.0)
MCHC: 31.9 g/dL (ref 30.0–36.0)
MCV: 96.7 fL (ref 80.0–100.0)
Monocytes Absolute: 0.5 10*3/uL (ref 0.1–1.0)
Monocytes Relative: 9 %
Neutro Abs: 2.9 10*3/uL (ref 1.7–7.7)
Neutrophils Relative %: 58 %
Platelets: 229 10*3/uL (ref 150–400)
RBC: 4.51 MIL/uL (ref 3.87–5.11)
RDW: 12 % (ref 11.5–15.5)
WBC: 5 10*3/uL (ref 4.0–10.5)
nRBC: 0 % (ref 0.0–0.2)

## 2018-11-17 LAB — BASIC METABOLIC PANEL
Anion gap: 9 (ref 5–15)
BUN: 14 mg/dL (ref 6–20)
CO2: 25 mmol/L (ref 22–32)
Calcium: 9.4 mg/dL (ref 8.9–10.3)
Chloride: 107 mmol/L (ref 98–111)
Creatinine, Ser: 0.87 mg/dL (ref 0.44–1.00)
GFR calc Af Amer: >60 mL/min (ref >60)
GFR calc non Af Amer: >60 mL/min (ref >60)
Glucose, Bld: 88 mg/dL (ref 70–99)
Potassium: 3.9 mmol/L (ref 3.5–5.1)
Sodium: 141 mmol/L (ref 135–145)

## 2018-11-17 LAB — TROPONIN I
Troponin I: <0.03 ng/mL (ref <0.03)
Troponin I: <0.03 ng/mL (ref <0.03)

## 2018-11-17 MED ORDER — IOHEXOL 350 MG/ML SOLN
100.0000 mL | Freq: Once | INTRAVENOUS | Status: AC | PRN
  Administered 2018-11-17: 67 mL via INTRAVENOUS

## 2018-11-17 MED ORDER — ACETAMINOPHEN 325 MG PO TABS
650.0000 mg | ORAL_TABLET | Freq: Once | ORAL | Status: AC
  Administered 2018-11-17: 16:00:00 650 mg via ORAL
  Filled 2018-11-17: qty 2

## 2018-11-17 NOTE — ED Notes (Signed)
Patient transported to X-ray 

## 2018-11-17 NOTE — ED Triage Notes (Signed)
Pt from home presents to ED via [facility] for resolved CP.  Pt reports her CP started around 1230 this afternoon and lasted for 30-38min.  Pt took 324mg  ASA and upon EMS arrival pt was pain free.  Pt does not have a cardiac history however has a history of PE.  Pt has been working from home for the last two weeks with no known exposure to COVID-19.

## 2018-11-17 NOTE — Discharge Instructions (Addendum)
Please read instructions below. °Follow up with your primary care provider regarding your visit today. ° Return to the ER for new or worsening symptoms; including worsening chest pain, shortness of breath, pain that radiates to the arm or neck, pain or shortness of breath worsened with exertion.  ° °

## 2018-11-17 NOTE — ED Provider Notes (Signed)
[facility] EMERGENCY DEPARTMENT Provider Note   CSN: [id number] Arrival date & time: 11/17/18  1420    History   Chief Complaint Chief Complaint  Patient presents with  . Chest Pain    HPI Kathryn Taylor is a 58 y.o. female with past medical history of pulmonary embolism, connective tissue disease, presenting to the emergency department with complaint of acute onset of chest pain that began around 12:30 PM this afternoon.  Patient states she was putting on her mascara, and had sudden sharp pain between her shoulder blades.  She states the pain moved to her anterior chest, described as an aching pain.  Pain lasted a total of 30 minutes, and resolved without intervention.  She did take aspirin to EMS arrival.  Denies associated shortness of breath, nausea, diaphoresis, cough.  History of PE was in the setting of oral contraceptives and prolonged travel.  She is not currently on anticoagulation.  States this does not feel similar to her history of PE, however thought of it when her symptoms began.  No cardiac history.  No first-degree relatives with cardiac death before the age of 10.     The history is provided by the patient.    Past Medical History:  Diagnosis Date  . Connective tissue disease (HCC)   . Depression   . History of pulmonary embolus (PE)   . Hypothyroidism   . PE (pulmonary embolism) 2006  . Thyroid disease     There are no active problems to display for this patient.   Past Surgical History:  Procedure Laterality Date  . ABDOMINAL HYSTERECTOMY       OB History   No obstetric history on file.      Home Medications    Prior to Admission medications   Medication Sig Start Date End Date Taking? Authorizing Provider  CALCIUM CARBONATE-VITAMIN D PO Take 1 tablet by mouth daily.   Yes [provider]  citalopram (CELEXA) 20 MG tablet Take 20 mg by mouth daily. 08/27/16  Yes [provider]  fluticasone (FLONASE) 50 MCG/ACT  nasal spray Place 2 sprays into the nose daily as needed for allergies.    Yes [provider]  hydroxychloroquine (PLAQUENIL) 200 MG tablet Take 400 mg by mouth at bedtime.  09/15/16  Yes [provider]  levothyroxine (SYNTHROID, LEVOTHROID) 125 MCG tablet Take 125 mcg by mouth daily. 08/27/16  Yes [provider]  meloxicam (MOBIC) 15 MG tablet Take 15 mg by mouth daily.   Yes [provider]  Multiple Vitamins-Minerals (MULTIVITAMIN ADULT PO) Take 1 tablet by mouth daily.   Yes [provider]  traZODone (DESYREL) 100 MG tablet Take 100 mg by mouth at bedtime. 09/12/16  Yes [provider]  Cyanocobalamin (B-12) 1000 MCG SUBL Place 1,000 mcg under the tongue daily. Patient not taking: Reported on 11/17/2018 12/23/16   Johney Maine, MD    Family History No family history on file.  Social History Social History   Tobacco Use  . Smoking status: Never Smoker  . Smokeless tobacco: Never Used  Substance Use Topics  . Alcohol use: Yes    Comment: 3-5x per week  . Drug use: Not on file     Allergies   Erythromycin; Morphine and related; Penicillins; and Sulfa antibiotics   Review of Systems Review of Systems  All other systems reviewed and are negative.    Physical Exam Updated Vital Signs BP 116/88   Pulse 66   Temp  98.7 F (37.1 C) (Oral)   Resp 12   Ht 5\' 4"  (1.626 m)   Wt 72.6 kg   SpO2 98%   BMI 27.46 kg/m   Physical Exam Vitals signs and nursing note reviewed.  Constitutional:      General: She is not in acute distress.    Appearance: She is well-developed. She is not ill-appearing.  HENT:     Head: Normocephalic and atraumatic.  Eyes:     Conjunctiva/sclera: Conjunctivae normal.  Cardiovascular:     Rate and Rhythm: Normal rate and regular rhythm.     Heart sounds: Normal heart sounds.  Pulmonary:     Effort: Pulmonary effort is normal. No respiratory distress.     Breath sounds: Normal breath  sounds.  Chest:     Chest wall: No tenderness.  Abdominal:     General: Bowel sounds are normal. There is no distension.     Palpations: Abdomen is soft.     Tenderness: There is no abdominal tenderness. There is no guarding.  Musculoskeletal:        General: No tenderness.     Right lower leg: No edema.     Left lower leg: No edema.     Comments: No calf tenderness, erythema, or warmth.  No swelling.  Skin:    General: Skin is warm.  Neurological:     Mental Status: She is alert.  Psychiatric:        Behavior: Behavior normal.      ED Treatments / Results  Labs (all labs ordered are listed, but only abnormal results are displayed) Labs Reviewed  BASIC METABOLIC PANEL  CBC WITH DIFFERENTIAL/PLATELET  TROPONIN I  TROPONIN I    EKG EKG Interpretation  Date/Time:  Thursday November 17 2018 14:52:59 EDT Ventricular Rate:  59 PR Interval:    QRS Duration: 87 QT Interval:  455 QTC Calculation: 451 R Axis:   78 Text Interpretation:  Sinus rhythm Baseline wander in lead(s) V2 V3 Partial missing lead(s): V2 Otherwise within normal limits Confirmed by Gerhard Munch [phone]) on 11/17/2018 2:56:47 PM   Radiology Dg Chest 2 View  Result Date: 11/17/2018 CLINICAL DATA:  Initial evaluation for acute chest pain. EXAM: CHEST - 2 VIEW COMPARISON:  Prior radiograph from 07/24/2011. FINDINGS: The cardiac and mediastinal silhouettes are stable in size and contour, and remain within normal limits. The lungs are normally inflated. No airspace consolidation, pleural effusion, or pulmonary edema is identified. There is no pneumothorax. No acute osseous abnormality identified. IMPRESSION: No active cardiopulmonary disease. Electronically Signed   By: Rise Mu M.D.   On: 11/17/2018 15:16   Ct Angio Chest Pe W/cm &/or Wo Cm  Result Date: 11/17/2018 CLINICAL DATA:  Chest pain and shortness of breath. History of pulmonary embolism. EXAM: CT ANGIOGRAPHY CHEST WITH CONTRAST TECHNIQUE:  Multidetector CT imaging of the chest was performed using the standard protocol during bolus administration of intravenous contrast. Multiplanar CT image reconstructions and MIPs were obtained to evaluate the vascular anatomy. CONTRAST:  63mL OMNIPAQUE IOHEXOL 350 MG/ML SOLN COMPARISON:  Chest x-ray from same day. CTA chest dated July 24, 2011. FINDINGS: Cardiovascular: Satisfactory opacification of the pulmonary arteries to the segmental level. No evidence of pulmonary embolism. Normal heart size. No pericardial effusion. No thoracic aortic aneurysm. The thoracic aorta is not well opacified due to phase of contrast. Mediastinum/Nodes: No enlarged mediastinal, hilar, or axillary lymph nodes. Thyroid gland, trachea, and esophagus demonstrate no significant findings. Lungs/Pleura: No focal consolidation, pleural effusion,  or pneumothorax. No suspicious pulmonary nodule. Upper Abdomen: No acute abnormality. Musculoskeletal: No chest wall abnormality. No acute or significant osseous findings. Review of the MIP images confirms the above findings. IMPRESSION: 1. No evidence of pulmonary embolism. No acute intrathoracic process. Electronically Signed   By: Obie Dredge M.D.   On: 11/17/2018 16:23    Procedures Procedures (including critical care time)  Medications Ordered in ED Medications  acetaminophen (TYLENOL) tablet 650 mg (650 mg Oral Given 11/17/18 1552)  iohexol (OMNIPAQUE) 350 MG/ML injection 100 mL (67 mLs Intravenous Contrast Given 11/17/18 1613)     Initial Impression / Assessment and Plan / ED Course  I have reviewed the triage vital signs and the nursing notes.  Pertinent labs & imaging results that were available during my care of the patient were reviewed by me and considered in my medical decision making (see chart for details).        Pt presenting with acute onset of chest pain that began in her back and moved to her left anterior chest, lasted 30 minutes and resolved without  intervention. Chest pain is not likely of cardiac or pulmonary etiology. CTA chest is neg for PE. VSS, no tracheal deviation, no JVD or new murmur, RRR, breath sounds equal bilaterally, EKG without acute abnormalities, negative troponin x2, and negative CXR. Low heart score. Patient is to be discharged with recommendation to follow up with PCP in regards to today's hospital visit. Pt has been advised to return to the ED if CP becomes exertional, associated with diaphoresis or nausea, radiates to left jaw/arm, worsens or becomes concerning in any way. Pt appears reliable for follow up and is agreeable to discharge.   Discussed patient with Dr. Particia Nearing, who agrees with workup and care plan.  Discussed results, findings, treatment and follow up. Patient advised of return precautions. Patient verbalized understanding and agreed with plan.  Final Clinical Impressions(s) / ED Diagnoses   Final diagnoses:  Atypical chest pain    ED Discharge Orders    None       Ebonye Reade, [place] N, PA-C 11/17/18 Clyda Hurdle, MD 11/17/18 2053

## 2018-11-17 NOTE — ED Notes (Signed)
Patient Alert and oriented to baseline. Stable and ambulatory to baseline. Patient verbalized understanding of the discharge instructions.  Patient belongings were taken by the patient.   

## 2018-11-18 DIAGNOSIS — J301 Allergic rhinitis due to pollen: Secondary | ICD-10-CM | POA: Diagnosis not present

## 2018-11-18 DIAGNOSIS — J3089 Other allergic rhinitis: Secondary | ICD-10-CM | POA: Diagnosis not present

## 2018-11-23 DIAGNOSIS — J3089 Other allergic rhinitis: Secondary | ICD-10-CM | POA: Diagnosis not present

## 2018-11-23 DIAGNOSIS — J301 Allergic rhinitis due to pollen: Secondary | ICD-10-CM | POA: Diagnosis not present

## 2018-11-28 DIAGNOSIS — J3089 Other allergic rhinitis: Secondary | ICD-10-CM | POA: Diagnosis not present

## 2018-11-28 DIAGNOSIS — J301 Allergic rhinitis due to pollen: Secondary | ICD-10-CM | POA: Diagnosis not present

## 2018-12-08 DIAGNOSIS — J301 Allergic rhinitis due to pollen: Secondary | ICD-10-CM | POA: Diagnosis not present

## 2018-12-08 DIAGNOSIS — J3089 Other allergic rhinitis: Secondary | ICD-10-CM | POA: Diagnosis not present

## 2018-12-12 DIAGNOSIS — J3089 Other allergic rhinitis: Secondary | ICD-10-CM | POA: Diagnosis not present

## 2018-12-12 DIAGNOSIS — J301 Allergic rhinitis due to pollen: Secondary | ICD-10-CM | POA: Diagnosis not present

## 2018-12-21 DIAGNOSIS — J301 Allergic rhinitis due to pollen: Secondary | ICD-10-CM | POA: Diagnosis not present

## 2018-12-21 DIAGNOSIS — J3089 Other allergic rhinitis: Secondary | ICD-10-CM | POA: Diagnosis not present

## 2018-12-28 DIAGNOSIS — J301 Allergic rhinitis due to pollen: Secondary | ICD-10-CM | POA: Diagnosis not present

## 2018-12-28 DIAGNOSIS — J3089 Other allergic rhinitis: Secondary | ICD-10-CM | POA: Diagnosis not present

## 2019-01-20 DIAGNOSIS — J301 Allergic rhinitis due to pollen: Secondary | ICD-10-CM | POA: Diagnosis not present

## 2019-01-20 DIAGNOSIS — J3089 Other allergic rhinitis: Secondary | ICD-10-CM | POA: Diagnosis not present

## 2019-02-08 DIAGNOSIS — J3089 Other allergic rhinitis: Secondary | ICD-10-CM | POA: Diagnosis not present

## 2019-02-08 DIAGNOSIS — J301 Allergic rhinitis due to pollen: Secondary | ICD-10-CM | POA: Diagnosis not present

## 2019-06-29 IMAGING — MG 2D DIGITAL DIAGNOSTIC BILATERAL MAMMOGRAM WITH CAD AND ADJUNCT T
8 of 15 series · 8 of 35 positions shown · non-contrast
Comparison: Mammography 08/26/2015, 05/23/2014 and earlier.

CLINICAL DATA: Palpable lump in the upper subareolar left breast
which the patient initially noted approximately 3 weeks ago. Annual
evaluation, right breast.

EXAM:
2D DIGITAL DIAGNOSTIC BILATERAL MAMMOGRAM WITH CAD AND ADJUNCT TOMO
ULTRASOUND LEFT BREAST

[R MLO]
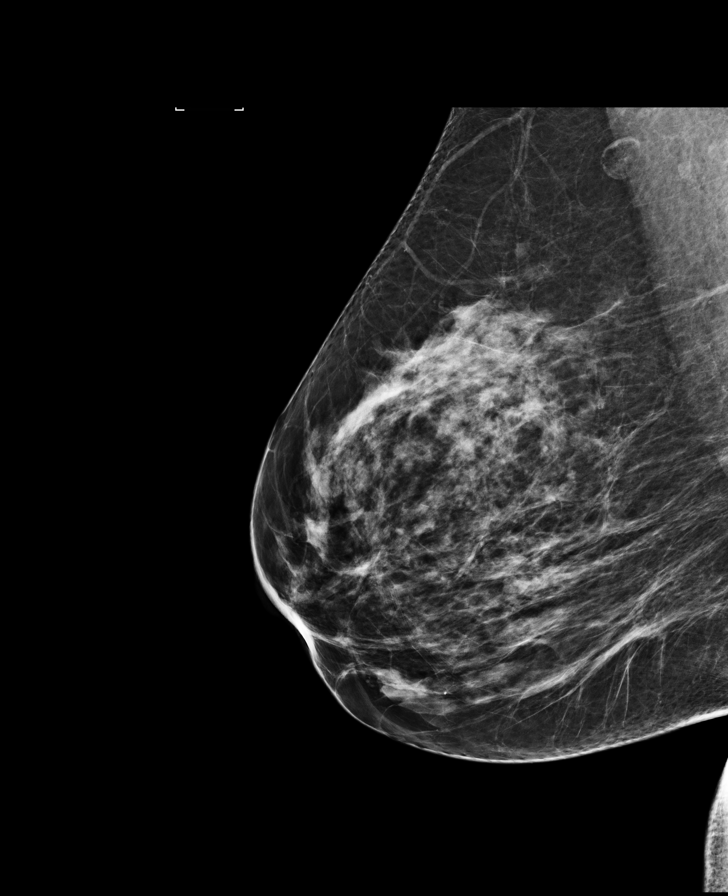

[R MLO synth-2D]
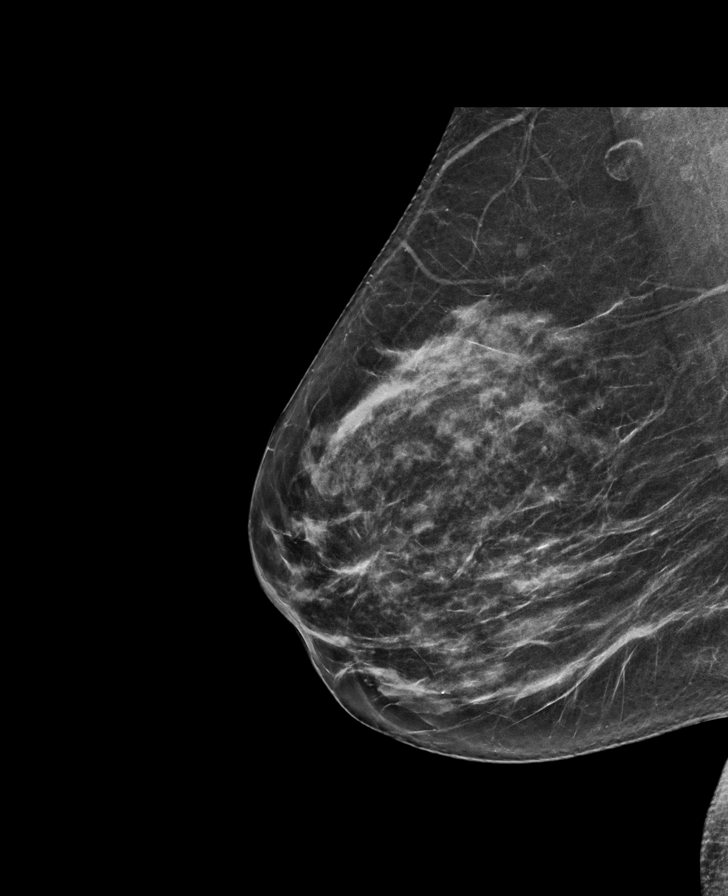

[L CC synth-2D]
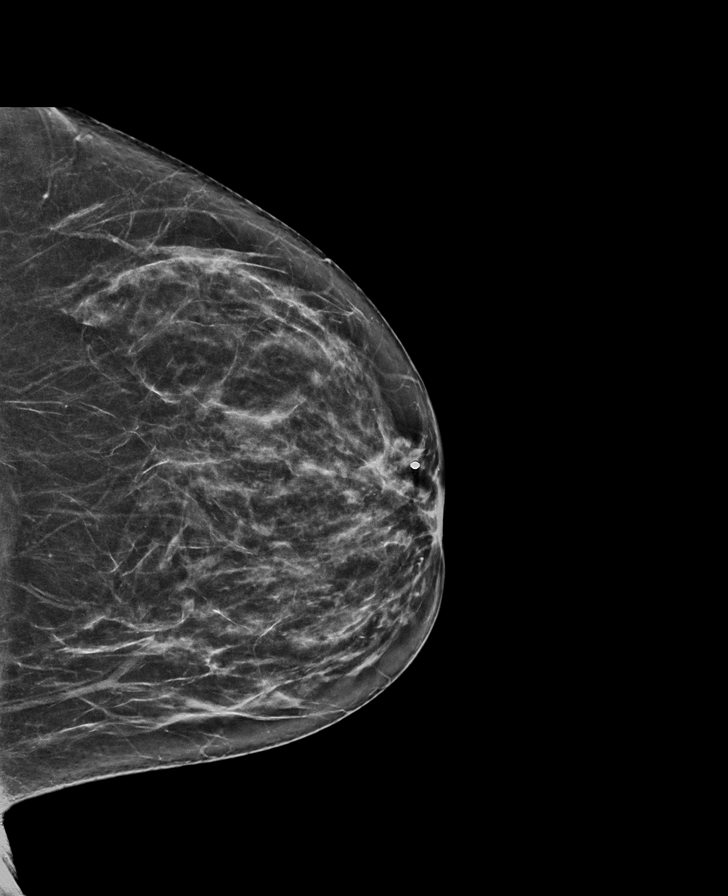

[L CC]
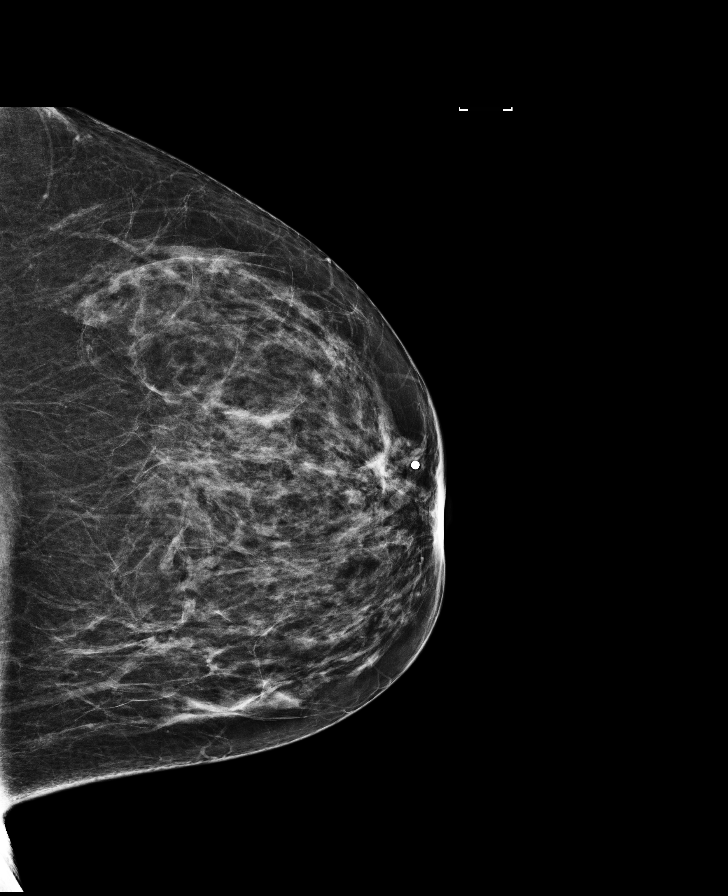

[L MLO (1 of 2)]
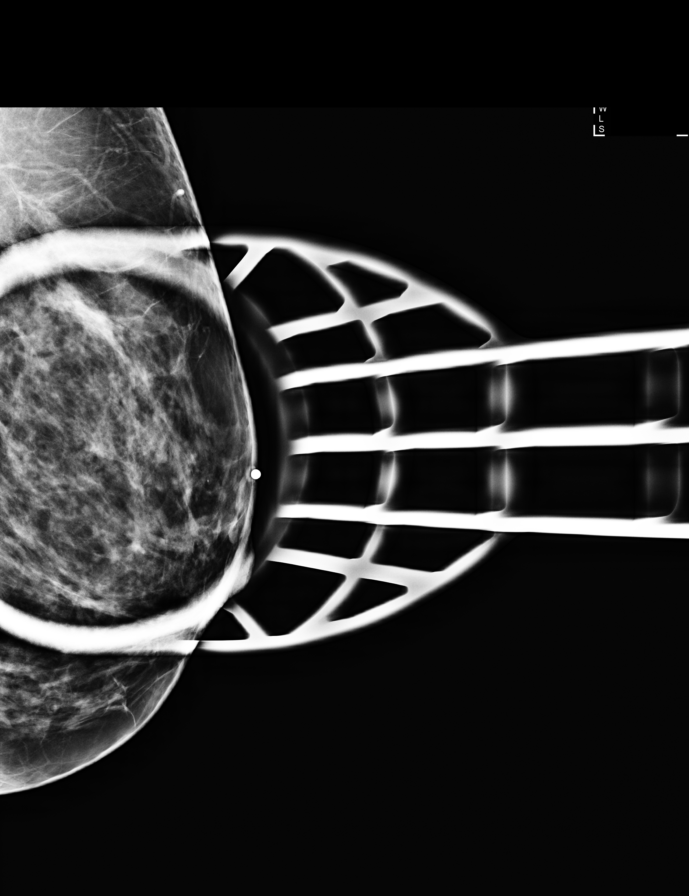

[R CC]
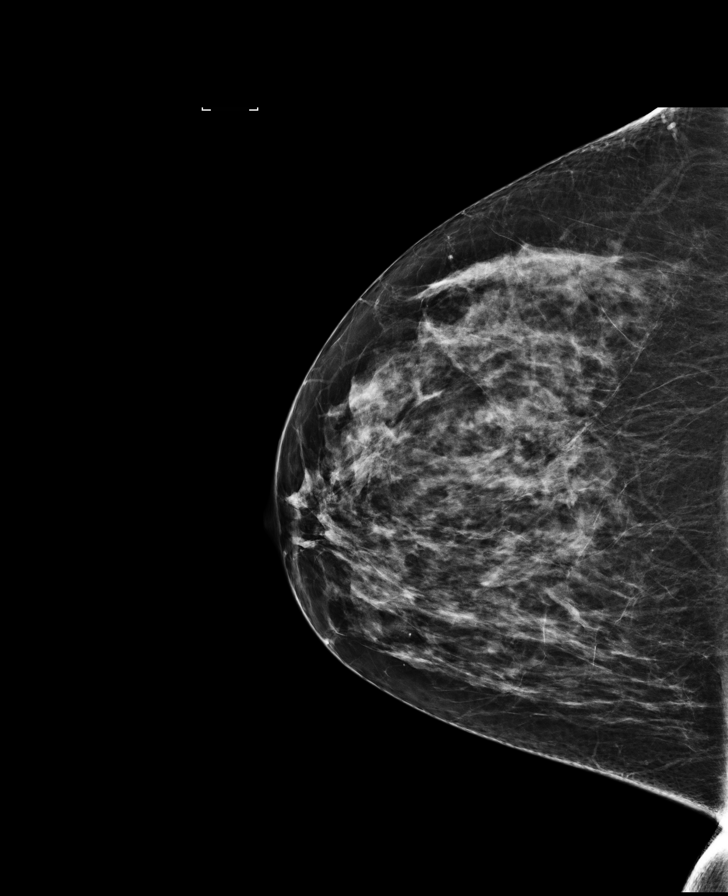

[R CC synth-2D]
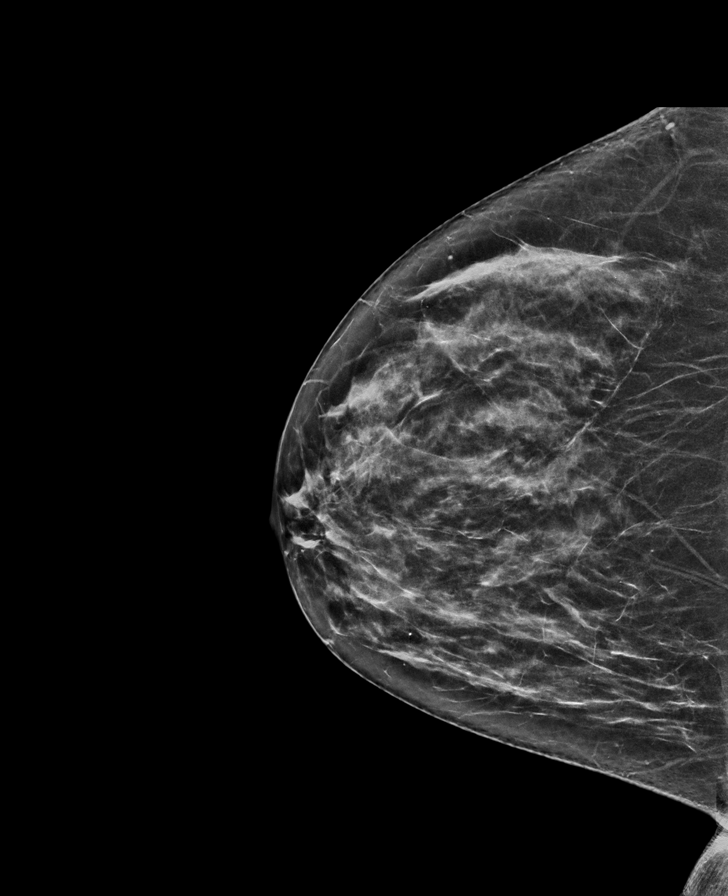

[L MLO (2 of 2)]
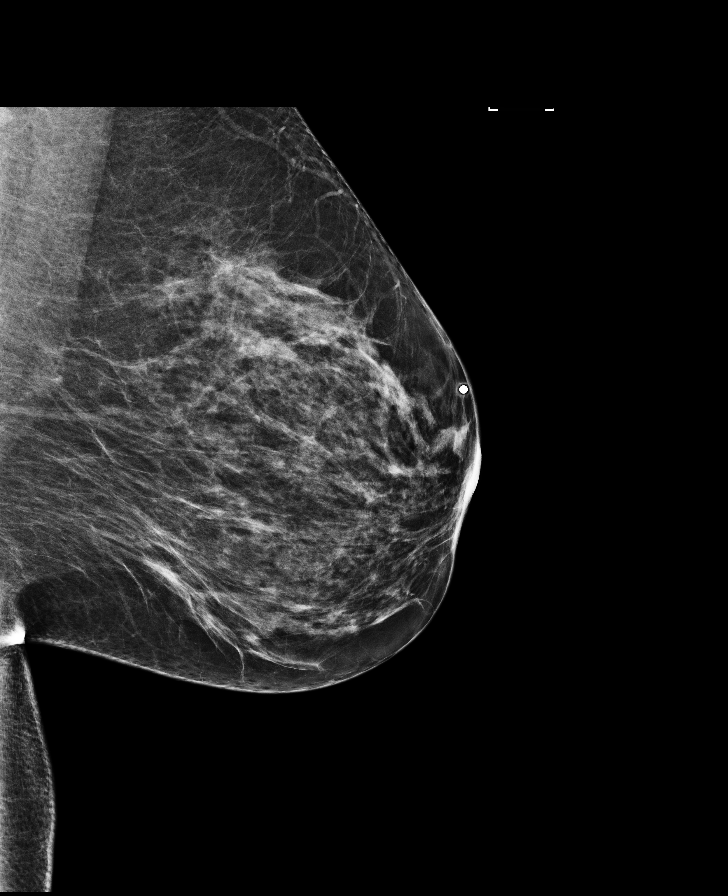

[8 of 35 positions shown; findings below may reference images not displayed]

No
prior ultrasound.

ACR Breast Density Category c: The breast tissue is heterogeneously
dense, which may obscure small masses.
FINDINGS: Standard 2D and tomosynthesis full field CC and MLO views of both
breasts were obtained. A standard and tomosynthesis spot tangential
view of the area palpable concern in the left breast was also
obtained.

No mammographic abnormality in the area of palpable concern in the
upper subareolar left breast.

No findings suspicious for malignancy in either breast.

Mammographic images were processed with CAD.

On physical exam, there is a palpable ridge of tissue in the upper
subareolar left breast in the area of patient concern, though I do
not palpate a discrete mass.

Targeted left breast ultrasound is performed, showing dense
fibroglandular tissue at the 12 o'clock subareolar location in the
area of palpable concern. The glandular tissue extends up to the
skin surface in this location, likely accounting for what the
patient is feeling. No cyst, solid mass or abnormal acoustic
shadowing is identified.

A mildly dilated duct is identified at the 1 o'clock subareolar
location, but there is no intraductal mass.
IMPRESSION: 1. No mammographic or sonographic evidence of malignancy involving
the left breast.
2. No mammographic evidence of malignancy involving the right
breast.

RECOMMENDATION:
Screening mammogram in one year.(Code:9S-[id number])

I have discussed the findings and recommendations with the patient.
Results were also provided in writing at the conclusion of the
visit. If applicable, a reminder letter will be sent to the patient
regarding the next appointment.

BI-RADS CATEGORY  1: Negative.

## 2019-10-09 ENCOUNTER — Ambulatory Visit: Payer: BLUE CROSS/BLUE SHIELD

## 2019-10-19 ENCOUNTER — Ambulatory Visit: Payer: Self-pay | Attending: Family

## 2019-10-19 DIAGNOSIS — Z23 Encounter for immunization: Secondary | ICD-10-CM | POA: Insufficient documentation

## 2019-10-19 NOTE — Progress Notes (Signed)
   Covid-19 Vaccination Clinic  Name:  Kathryn Taylor    MRN: [id number] DOB: Oct 19, 1960  10/19/2019  [name] was observed post Covid-19 immunization for 30 minutes based on pre-vaccination screening without incident. She was provided with Vaccine Information Sheet and instruction to access the V-Safe system.   [name] was instructed to call 911 with any severe reactions post vaccine: Marland Kitchen Difficulty breathing  . Swelling of face and throat  . A fast heartbeat  . A bad rash all over body  . Dizziness and weakness   Immunizations Administered    Name Date Dose VIS Date Route   Moderna COVID-19 Vaccine 10/19/2019  3:47 PM 0.5 mL 07/18/2019 Intramuscular   Manufacturer: Moderna   Lot: [id number]   NDC: 16579-038-33

## 2019-11-21 ENCOUNTER — Ambulatory Visit: Payer: Self-pay | Attending: Family

## 2019-11-21 DIAGNOSIS — Z23 Encounter for immunization: Secondary | ICD-10-CM

## 2019-11-21 NOTE — Progress Notes (Signed)
   Covid-19 Vaccination Clinic  Name:  Ani Deoliveira    MRN: [id number] DOB: [birth year]  11/21/2019  Ms. Beske was observed post Covid-19 immunization for 15 minutes without incident. She was provided with Vaccine Information Sheet and instruction to access the V-Safe system.   Ms. Lauricella was instructed to call 911 with any severe reactions post vaccine: Marland Kitchen Difficulty breathing  . Swelling of face and throat  . A fast heartbeat  . A bad rash all over body  . Dizziness and weakness   Immunizations Administered    Name Date Dose VIS Date Route   Moderna COVID-19 Vaccine 11/21/2019  3:42 PM 0.5 mL 07/18/2019 Intramuscular   Manufacturer: Moderna   Lot: [id number]   NDC: 27253-664-40

## 2020-11-23 ENCOUNTER — Other Ambulatory Visit (HOSPITAL_COMMUNITY): Payer: Self-pay

## 2020-11-23 ENCOUNTER — Encounter: Payer: Self-pay | Admitting: Unknown Physician Specialty

## 2020-11-23 ENCOUNTER — Telehealth: Payer: Self-pay | Admitting: Unknown Physician Specialty

## 2020-11-23 MED ORDER — NIRMATRELVIR/RITONAVIR (PAXLOVID)TABLET
3.0000 | ORAL_TABLET | Freq: Two times a day (BID) | ORAL | 0 refills | Status: AC
  Filled 2020-11-23: qty 30, 5d supply, fill #0

## 2020-11-23 NOTE — Telephone Encounter (Signed)
Outpatient Oral COVID Treatment Note  I connected with Kathryn Taylor on 11/23/2020/12:24 PM by telephone and verified that I am speaking with the correct person using two identifiers.  I discussed the limitations, risks, security, and privacy concerns of performing an evaluation and management service by telephone and the availability of in person appointments. I also discussed with the patient that there may be a patient responsible charge related to this service. The patient expressed understanding and agreed to proceed.  Patient location: home Provider location: home  Diagnosis: COVID-19 infection  Purpose of visit: Discussion of potential use of Molnupiravir or Paxlovid, a new treatment for mild to moderate COVID-19 viral infection in non-hospitalized patients.   Subjective: Patient is a 60 y.o. female who has been diagnosed with COVID 19 viral infection.  Their symptoms began on 4/6 with sore throat and congestion.    Past Medical History:  Diagnosis Date  . Connective tissue disease (HCC)   . Depression   . History of pulmonary embolus (PE)   . Hypothyroidism   . PE (pulmonary embolism) 2006  . Thyroid disease     Allergies  Allergen Reactions  . Erythromycin Other (See Comments)    Heart palpatations  . Morphine And Related Swelling and Rash  . Penicillins Rash  . Sulfa Antibiotics Swelling and Rash     Current Outpatient Medications:  .  CALCIUM CARBONATE-VITAMIN D PO, Take 1 tablet by mouth daily., Disp: , Rfl:  .  citalopram (CELEXA) 20 MG tablet, Take 20 mg by mouth daily., Disp: , Rfl: 0 .  Cyanocobalamin (B-12) 1000 MCG SUBL, Place 1,000 mcg under the tongue daily. (Patient not taking: Reported on 11/17/2018), Disp: 30 each, Rfl: 3 .  fluticasone (FLONASE) 50 MCG/ACT nasal spray, Place 2 sprays into the nose daily as needed for allergies. , Disp: , Rfl:  .  hydroxychloroquine (PLAQUENIL) 200 MG tablet, Take 400 mg by mouth at bedtime. , Disp: , Rfl: 2 .  levothyroxine  (SYNTHROID, LEVOTHROID) 125 MCG tablet, Take 125 mcg by mouth daily., Disp: , Rfl: 0 .  meloxicam (MOBIC) 15 MG tablet, Take 15 mg by mouth daily., Disp: , Rfl:  .  Multiple Vitamins-Minerals (MULTIVITAMIN ADULT PO), Take 1 tablet by mouth daily., Disp: , Rfl:  .  traZODone (DESYREL) 100 MG tablet, Take 100 mg by mouth at bedtime., Disp: , Rfl: 1  Objective: Patient appears/sounds congested.  They are in no apparent distress.  Breathing is non labored.  Mood and behavior are normal.  Laboratory Data:  No results found for this or any previous visit (from the past 2160 hour(s)).   Assessment: 60 y.o. female with mild/moderate COVID 19 viral infection diagnosed on 4/6 at high risk for progression to severe COVID 19.  Plan:  This patient is a 60 y.o. female that meets the following criteria for Emergency Use Authorization of: Paxlovid 1. Age >12 yr AND > 40 kg 2. SARS-COV-2 positive test 3. Symptom onset < 5 days 4. Mild-to-moderate COVID disease with high risk for severe progression to hospitalization or death  I have spoken and communicated the following to the patient or parent/caregiver regarding: 1. Paxlovid is an unapproved drug that is authorized for use under an Emergency Use Authorization.  2. There are no adequate, approved, available products for the treatment of COVID-19 in adults who have mild-to-moderate COVID-19 and are at high risk for progressing to severe COVID-19, including hospitalization or death. 3. Other therapeutics are currently authorized. For additional information on all products  authorized for treatment or prevention of COVID-19, please see [email or web address]-[email or web address]  4. There are benefits and risks of taking this treatment as outlined in the "Fact Sheet for Patients and Caregivers."  5. "Fact Sheet for Patients and Caregivers" was reviewed with patient. A hard copy will  be provided to patient from pharmacy prior to the patient receiving treatment. 6. Patients should continue to self-isolate and use infection control measures (e.g., wear mask, isolate, social distance, avoid sharing personal items, clean and disinfect "high touch" surfaces, and frequent handwashing) according to CDC guidelines.  7. The patient or parent/caregiver has the option to accept or refuse treatment. 8. Patient medication history was reviewed for potential drug interactions:Interaction with home meds: Will stop Flonase and take 1/2 of Trazadone 9. Patient's GFR was calculated to be >60, and they were therefore prescribed Normal dose (GFR>60) - nirmatrelvir 150mg  tab (2 tablet) by mouth twice daily AND ritonavir 100mg  tab (1 tablet) by mouth twice daily   After reviewing above information with the patient, the patient agrees to receive molnupiravir.  Follow up instructions:    . Take prescription BID x 5 days as directed . Reach out to pharmacist for counseling on medication if desired . For concerns regarding further COVID symptoms please follow up with your PCP or urgent care . For urgent or life-threatening issues, seek care at your local emergency department  The patient was provided an opportunity to ask questions, and all were answered. The patient agreed with the plan and demonstrated an understanding of the instructions.   Script sent to [facility] and opted to pick up RX.  The patient was advised to call their PCP or seek an in-person evaluation if the symptoms worsen or if the condition fails to improve as anticipated.   I provided 20 minutes of non face-to-face telephone visit time during this encounter, and > 50% was spent counseling as documented under my assessment & plan.  , NP 11/23/2020 /12:24 PM

## 2021-04-07 ENCOUNTER — Other Ambulatory Visit: Payer: Self-pay | Admitting: Family Medicine

## 2021-04-07 ENCOUNTER — Ambulatory Visit
Admission: RE | Admit: 2021-04-07 | Discharge: 2021-04-07 | Disposition: A | Discharge status: Home / Self Care | Payer: Self-pay | Source: Ambulatory Visit | Attending: Family Medicine | Admitting: Family Medicine

## 2021-04-07 ENCOUNTER — Other Ambulatory Visit: Payer: Self-pay

## 2021-04-07 DIAGNOSIS — R52 Pain, unspecified: Secondary | ICD-10-CM

## 2021-04-08 ENCOUNTER — Other Ambulatory Visit: Payer: Self-pay | Admitting: Family Medicine

## 2021-04-08 DIAGNOSIS — W19XXXA Unspecified fall, initial encounter: Secondary | ICD-10-CM

## 2021-04-08 DIAGNOSIS — R109 Unspecified abdominal pain: Secondary | ICD-10-CM

## 2021-04-08 DIAGNOSIS — R0789 Other chest pain: Secondary | ICD-10-CM

## 2021-04-08 DIAGNOSIS — R0781 Pleurodynia: Secondary | ICD-10-CM

## 2021-04-16 ENCOUNTER — Other Ambulatory Visit: Payer: Self-pay

## 2021-04-16 ENCOUNTER — Ambulatory Visit
Admission: RE | Admit: 2021-04-16 | Discharge: 2021-04-16 | Disposition: A | Discharge status: Home / Self Care | Payer: Managed Care, Other (non HMO) | Source: Ambulatory Visit | Attending: Family Medicine | Admitting: Family Medicine

## 2021-04-16 DIAGNOSIS — R109 Unspecified abdominal pain: Secondary | ICD-10-CM

## 2021-04-16 DIAGNOSIS — R0781 Pleurodynia: Secondary | ICD-10-CM

## 2021-04-16 DIAGNOSIS — W19XXXA Unspecified fall, initial encounter: Secondary | ICD-10-CM

## 2022-02-27 DIAGNOSIS — J3089 Other allergic rhinitis: Secondary | ICD-10-CM | POA: Diagnosis not present

## 2022-02-27 DIAGNOSIS — J3081 Allergic rhinitis due to animal (cat) (dog) hair and dander: Secondary | ICD-10-CM | POA: Diagnosis not present

## 2022-02-27 DIAGNOSIS — J301 Allergic rhinitis due to pollen: Secondary | ICD-10-CM | POA: Diagnosis not present

## 2022-03-09 DIAGNOSIS — J3081 Allergic rhinitis due to animal (cat) (dog) hair and dander: Secondary | ICD-10-CM | POA: Diagnosis not present

## 2022-03-09 DIAGNOSIS — J301 Allergic rhinitis due to pollen: Secondary | ICD-10-CM | POA: Diagnosis not present

## 2022-03-09 DIAGNOSIS — J3089 Other allergic rhinitis: Secondary | ICD-10-CM | POA: Diagnosis not present

## 2022-03-11 DIAGNOSIS — J301 Allergic rhinitis due to pollen: Secondary | ICD-10-CM | POA: Diagnosis not present

## 2022-03-11 DIAGNOSIS — J3081 Allergic rhinitis due to animal (cat) (dog) hair and dander: Secondary | ICD-10-CM | POA: Diagnosis not present

## 2022-03-11 DIAGNOSIS — J3089 Other allergic rhinitis: Secondary | ICD-10-CM | POA: Diagnosis not present

## 2022-03-18 DIAGNOSIS — J3089 Other allergic rhinitis: Secondary | ICD-10-CM | POA: Diagnosis not present

## 2022-03-18 DIAGNOSIS — J301 Allergic rhinitis due to pollen: Secondary | ICD-10-CM | POA: Diagnosis not present

## 2022-03-20 DIAGNOSIS — J301 Allergic rhinitis due to pollen: Secondary | ICD-10-CM | POA: Diagnosis not present

## 2022-03-20 DIAGNOSIS — J3081 Allergic rhinitis due to animal (cat) (dog) hair and dander: Secondary | ICD-10-CM | POA: Diagnosis not present

## 2022-03-20 DIAGNOSIS — J3089 Other allergic rhinitis: Secondary | ICD-10-CM | POA: Diagnosis not present

## 2022-03-30 DIAGNOSIS — J3081 Allergic rhinitis due to animal (cat) (dog) hair and dander: Secondary | ICD-10-CM | POA: Diagnosis not present

## 2022-03-30 DIAGNOSIS — J301 Allergic rhinitis due to pollen: Secondary | ICD-10-CM | POA: Diagnosis not present

## 2022-03-30 DIAGNOSIS — J3089 Other allergic rhinitis: Secondary | ICD-10-CM | POA: Diagnosis not present

## 2022-04-03 DIAGNOSIS — J3089 Other allergic rhinitis: Secondary | ICD-10-CM | POA: Diagnosis not present

## 2022-04-03 DIAGNOSIS — J3081 Allergic rhinitis due to animal (cat) (dog) hair and dander: Secondary | ICD-10-CM | POA: Diagnosis not present

## 2022-04-03 DIAGNOSIS — J301 Allergic rhinitis due to pollen: Secondary | ICD-10-CM | POA: Diagnosis not present

## 2022-05-06 DIAGNOSIS — J3089 Other allergic rhinitis: Secondary | ICD-10-CM | POA: Diagnosis not present

## 2022-05-06 DIAGNOSIS — J301 Allergic rhinitis due to pollen: Secondary | ICD-10-CM | POA: Diagnosis not present

## 2022-05-06 DIAGNOSIS — J3081 Allergic rhinitis due to animal (cat) (dog) hair and dander: Secondary | ICD-10-CM | POA: Diagnosis not present

## 2022-05-07 DIAGNOSIS — J301 Allergic rhinitis due to pollen: Secondary | ICD-10-CM | POA: Diagnosis not present

## 2022-05-07 DIAGNOSIS — J3089 Other allergic rhinitis: Secondary | ICD-10-CM | POA: Diagnosis not present

## 2022-05-15 DIAGNOSIS — J3089 Other allergic rhinitis: Secondary | ICD-10-CM | POA: Diagnosis not present

## 2022-05-15 DIAGNOSIS — J301 Allergic rhinitis due to pollen: Secondary | ICD-10-CM | POA: Diagnosis not present

## 2022-05-20 DIAGNOSIS — M359 Systemic involvement of connective tissue, unspecified: Secondary | ICD-10-CM | POA: Diagnosis not present

## 2022-05-20 DIAGNOSIS — Z79899 Other long term (current) drug therapy: Secondary | ICD-10-CM | POA: Diagnosis not present

## 2022-05-20 DIAGNOSIS — M064 Inflammatory polyarthropathy: Secondary | ICD-10-CM | POA: Diagnosis not present

## 2022-05-20 DIAGNOSIS — L932 Other local lupus erythematosus: Secondary | ICD-10-CM | POA: Diagnosis not present

## 2022-06-15 DIAGNOSIS — R519 Headache, unspecified: Secondary | ICD-10-CM | POA: Diagnosis not present

## 2022-06-15 DIAGNOSIS — Z20828 Contact with and (suspected) exposure to other viral communicable diseases: Secondary | ICD-10-CM | POA: Diagnosis not present

## 2022-06-15 DIAGNOSIS — J029 Acute pharyngitis, unspecified: Secondary | ICD-10-CM | POA: Diagnosis not present

## 2022-06-22 DIAGNOSIS — J3081 Allergic rhinitis due to animal (cat) (dog) hair and dander: Secondary | ICD-10-CM | POA: Diagnosis not present

## 2022-06-22 DIAGNOSIS — J3089 Other allergic rhinitis: Secondary | ICD-10-CM | POA: Diagnosis not present

## 2022-06-22 DIAGNOSIS — J301 Allergic rhinitis due to pollen: Secondary | ICD-10-CM | POA: Diagnosis not present

## 2022-06-25 DIAGNOSIS — J3089 Other allergic rhinitis: Secondary | ICD-10-CM | POA: Diagnosis not present

## 2022-06-25 DIAGNOSIS — J301 Allergic rhinitis due to pollen: Secondary | ICD-10-CM | POA: Diagnosis not present

## 2022-06-25 DIAGNOSIS — J3081 Allergic rhinitis due to animal (cat) (dog) hair and dander: Secondary | ICD-10-CM | POA: Diagnosis not present

## 2022-07-06 DIAGNOSIS — K120 Recurrent oral aphthae: Secondary | ICD-10-CM | POA: Diagnosis not present

## 2022-07-06 DIAGNOSIS — R5383 Other fatigue: Secondary | ICD-10-CM | POA: Diagnosis not present

## 2022-07-06 DIAGNOSIS — Z6829 Body mass index (BMI) 29.0-29.9, adult: Secondary | ICD-10-CM | POA: Diagnosis not present

## 2022-07-06 DIAGNOSIS — B349 Viral infection, unspecified: Secondary | ICD-10-CM | POA: Diagnosis not present

## 2022-07-07 DIAGNOSIS — R051 Acute cough: Secondary | ICD-10-CM | POA: Diagnosis not present

## 2022-07-29 DIAGNOSIS — Z6828 Body mass index (BMI) 28.0-28.9, adult: Secondary | ICD-10-CM | POA: Diagnosis not present

## 2022-07-29 DIAGNOSIS — H10023 Other mucopurulent conjunctivitis, bilateral: Secondary | ICD-10-CM | POA: Diagnosis not present

## 2022-08-20 DIAGNOSIS — J3081 Allergic rhinitis due to animal (cat) (dog) hair and dander: Secondary | ICD-10-CM | POA: Diagnosis not present

## 2022-08-20 DIAGNOSIS — J3089 Other allergic rhinitis: Secondary | ICD-10-CM | POA: Diagnosis not present

## 2022-08-20 DIAGNOSIS — J301 Allergic rhinitis due to pollen: Secondary | ICD-10-CM | POA: Diagnosis not present

## 2022-08-26 ENCOUNTER — Emergency Department (HOSPITAL_COMMUNITY): Payer: BC Managed Care – PPO

## 2022-08-26 ENCOUNTER — Encounter (HOSPITAL_COMMUNITY): Payer: Self-pay | Admitting: Emergency Medicine

## 2022-08-26 ENCOUNTER — Emergency Department (HOSPITAL_COMMUNITY)
Admission: EM | Admit: 2022-08-26 | Discharge: 2022-08-26 | Disposition: A | Discharge status: Home / Self Care | Payer: BC Managed Care – PPO | Attending: Emergency Medicine | Admitting: Emergency Medicine

## 2022-08-26 DIAGNOSIS — I471 Supraventricular tachycardia, unspecified: Secondary | ICD-10-CM | POA: Diagnosis not present

## 2022-08-26 DIAGNOSIS — R002 Palpitations: Secondary | ICD-10-CM | POA: Diagnosis not present

## 2022-08-26 LAB — BASIC METABOLIC PANEL
Anion gap: 14 (ref 5–15)
BUN: 22 mg/dL — ABNORMAL HIGH (ref 6–20)
CO2: 19 mmol/L — ABNORMAL LOW (ref 22–32)
Calcium: 9.1 mg/dL (ref 8.9–10.3)
Chloride: 104 mmol/L (ref 98–111)
Creatinine, Ser: 1.01 mg/dL — ABNORMAL HIGH (ref 0.44–1.00)
GFR, Estimated: >60 mL/min (ref >60)
Glucose, Bld: 88 mg/dL (ref 70–99)
Potassium: 3.4 mmol/L — ABNORMAL LOW (ref 3.5–5.1)
Sodium: 137 mmol/L (ref 135–145)

## 2022-08-26 LAB — CBC WITH DIFFERENTIAL/PLATELET
Abs Immature Granulocytes: 0.02 10*3/uL (ref 0.00–0.07)
Basophils Absolute: 0.1 10*3/uL (ref 0.0–0.1)
Basophils Relative: 1 %
Eosinophils Absolute: 0.1 10*3/uL (ref 0.0–0.5)
Eosinophils Relative: 1 %
HCT: 42.2 % (ref 36.0–46.0)
Hemoglobin: 14 g/dL (ref 12.0–15.0)
Immature Granulocytes: 0 %
Lymphocytes Relative: 38 %
Lymphs Abs: 3 10*3/uL (ref 0.7–4.0)
MCH: 32.1 pg (ref 26.0–34.0)
MCHC: 33.2 g/dL (ref 30.0–36.0)
MCV: 96.8 fL (ref 80.0–100.0)
Monocytes Absolute: 0.6 10*3/uL (ref 0.1–1.0)
Monocytes Relative: 7 %
Neutro Abs: 4.1 10*3/uL (ref 1.7–7.7)
Neutrophils Relative %: 53 %
Platelets: 251 10*3/uL (ref 150–400)
RBC: 4.36 MIL/uL (ref 3.87–5.11)
RDW: 12.3 % (ref 11.5–15.5)
WBC: 7.9 10*3/uL (ref 4.0–10.5)
nRBC: 0 % (ref 0.0–0.2)

## 2022-08-26 LAB — TSH: TSH: 0.717 u[IU]/mL (ref 0.350–4.500)

## 2022-08-26 MED ORDER — ADENOSINE 6 MG/2ML IV SOLN
6.0000 mg | Freq: Once | INTRAVENOUS | Status: AC
  Administered 2022-08-26: 6 mg via INTRAVENOUS

## 2022-08-26 MED ORDER — SODIUM CHLORIDE 0.9 % IV SOLN: INTRAVENOUS | Status: DC

## 2022-08-26 MED ORDER — ADENOSINE 6 MG/2ML IV SOLN
12.0000 mg | Freq: Once | INTRAVENOUS | Status: AC
  Administered 2022-08-26: 12 mg via INTRAVENOUS

## 2022-08-26 MED ORDER — ADENOSINE 6 MG/2ML IV SOLN
INTRAVENOUS | Status: AC
  Filled 2022-08-26: qty 2

## 2022-08-26 NOTE — Discharge Instructions (Signed)
Chest x-ray without any acute findings.  Labs are normal including your thyroid-stimulating hormone.  Referral made to cardiology.  They will be contacting you.  Make an appointment follow-up with your primary care doctor.  Return for any new or worse symptoms.  Return for rapid heart rate if it does not respond to Valsalva maneuvers.  Also if it does not resolve in 40 minutes she need to be seen.

## 2022-08-26 NOTE — ED Provider Notes (Addendum)
[facility]-EMERGENCY DEPT Provider Note   CSN: [id number] Arrival date & time: 08/26/22  1835     History  Chief Complaint  Patient presents with   Tachycardia    Kathryn Taylor is a 62 y.o. female.  Patient starting about an hour prior to arrival was outside doing some yard work noted that her heart was going fast.  Feeling of palpitations.  Patient actually able to drive home they look some things up on the Internet they tried some Valsalva maneuvers.  Without any help.  Patient has not had this happen before.  Patient just with a strange feeling in her chest and she can tell her heart is going fast.  A little bit of numbness and tingling to her hands.  But no significant chest pain.  Past medical history significant for thyroid disease pulmonary embolism connective tissue disease and hypothyroidism.  Patient is on Levothyroid.  Patient is not a user of tobacco products.  Patient feeling well earlier in the day.       Home Medications Prior to Admission medications   Medication Sig Start Date End Date Taking? Authorizing Provider  CALCIUM CARBONATE-VITAMIN D PO Take 1 tablet by mouth daily.    [provider]  citalopram (CELEXA) 20 MG tablet Take 20 mg by mouth daily. 08/27/16   [provider]  Cyanocobalamin (B-12) 1000 MCG SUBL Place 1,000 mcg under the tongue daily. Patient not taking: Reported on 11/17/2018 12/23/16   Johney Maine, MD  fluticasone [facility]) 50 MCG/ACT nasal spray Place 2 sprays into the nose daily as needed for allergies.     [provider]  hydroxychloroquine (PLAQUENIL) 200 MG tablet Take 400 mg by mouth at bedtime.  09/15/16   [provider]  levothyroxine (SYNTHROID, LEVOTHROID) 125 MCG tablet Take 125 mcg by mouth daily. 08/27/16   [provider]  meloxicam (MOBIC) 15 MG tablet Take 15 mg by mouth daily.    [provider]  Multiple Vitamins-Minerals (MULTIVITAMIN ADULT PO)  Take 1 tablet by mouth daily.    [provider]  traZODone (DESYREL) 100 MG tablet Take 100 mg by mouth at bedtime. 09/12/16   [provider]      Allergies    Erythromycin, Morphine and related, Penicillins, and Sulfa antibiotics    Review of Systems   Review of Systems  Constitutional:  Negative for chills and fever.  HENT:  Negative for ear pain and sore throat.   Eyes:  Negative for pain and visual disturbance.  Respiratory:  Negative for cough and shortness of breath.   Cardiovascular:  Positive for palpitations. Negative for chest pain.  Gastrointestinal:  Negative for abdominal pain and vomiting.  Genitourinary:  Negative for dysuria and hematuria.  Musculoskeletal:  Negative for arthralgias and back pain.  Skin:  Negative for color change and rash.  Neurological:  Positive for numbness. Negative for seizures and syncope.  All other systems reviewed and are negative.   Physical Exam Updated Vital Signs There were no vitals taken for this visit. Physical Exam Vitals and nursing note reviewed.  Constitutional:      General: She is not in acute distress.    Appearance: Normal appearance. She is well-developed.  HENT:     Head: Normocephalic and atraumatic.  Eyes:     Extraocular Movements: Extraocular movements intact.     Conjunctiva/sclera: Conjunctivae normal.     Pupils: Pupils are equal, round, and reactive to light.  Cardiovascular:  Rate and Rhythm: Regular rhythm. Tachycardia present.     Heart sounds: No murmur heard. Pulmonary:     Effort: Pulmonary effort is normal. No respiratory distress.     Breath sounds: Normal breath sounds.  Abdominal:     Palpations: Abdomen is soft.     Tenderness: There is no abdominal tenderness.  Musculoskeletal:        General: No swelling.     Cervical back: Neck supple.  Skin:    General: Skin is warm and dry.     Capillary Refill: Capillary refill takes less than 2 seconds.  Neurological:      General: No focal deficit present.     Mental Status: She is alert and oriented to person, place, and time.  Psychiatric:        Mood and Affect: Mood normal.     ED Results / Procedures / Treatments   Labs (all labs ordered are listed, but only abnormal results are displayed) Labs Reviewed  CBC WITH DIFFERENTIAL/PLATELET  BASIC METABOLIC PANEL    EKG None  Radiology No results found.  Procedures Procedures    Medications Ordered in ED Medications  adenosine (ADENOCARD) 6 MG/2ML injection (has no administration in time range)  0.9 %  sodium chloride infusion (has no administration in time range)  adenosine (ADENOCARD) 6 MG/2ML injection 6 mg (6 mg Intravenous Given 08/26/22 1856)  adenosine (ADENOCARD) 6 MG/2ML injection 6 mg (6 mg Intravenous Given 08/26/22 1857)  adenosine (ADENOCARD) 6 MG/2ML injection 12 mg (12 mg Intravenous Given 08/26/22 1900)    ED Course/ Medical Decision Making/ A&P                           Medical Decision Making Amount and/or Complexity of Data Reviewed Labs: ordered. Radiology: ordered.  Risk Prescription drug management.   CRITICAL CARE Performed by: Fredia Sorrow Total critical care time: 40 minutes Critical care time was exclusive of separately billable procedures and treating other patients. Critical care was necessary to treat or prevent imminent or life-threatening deterioration. Critical care was time spent personally by me on the following activities: development of treatment plan with patient and/or surrogate as well as nursing, discussions with consultants, evaluation of patient's response to treatment, examination of patient, obtaining history from patient or surrogate, ordering and performing treatments and interventions, ordering and review of laboratory studies, ordering and review of radiographic studies, pulse oximetry and re-evaluation of patient's condition.  Patient placed on cardiac monitor.  Monitor seem to be  consistent with supraventricular tachycardia.  Twelve-lead EKG consistent with SVT as well.  Patient's blood pressure good.  IV established in the antecubital area patient started on 2 L of oxygen.  Will monitor brought in patient hooked up to the [facility] monitor.  Suction set up as well.  Attempted conversion with adenosine we tried 6 mg twice never got a pause.  Got on the elevator arm bigger bolus went with 12 and we got blockage and conversion back to initially sinus tachycardia and now just sinus rhythm.  Patient tolerated procedure well feeling much better.  Will check labs including TSH.  Will get chest x-ray.  Will continue to monitor.  Patient's chest x-ray without any acute findings.  Patient's labs CBC basic metabolic panel normal other than potassium being slightly low at 3.4.  TSH is normal at 0.717.  Patient has a history of hypothyroidism.  Patient remained in sinus rhythm.  Patient without any persistent symptoms.  Patient stable for discharge home  Final Clinical Impression(s) / ED Diagnoses Final diagnoses:  SVT (supraventricular tachycardia)    Rx / DC Orders ED Discharge Orders     None         Fredia Sorrow, MD 08/26/22 Doran Heater    Fredia Sorrow, MD 08/26/22 2149

## 2022-08-26 NOTE — ED Triage Notes (Signed)
Pt here from home with c/o heart racing  after doing some work in the yard , pt arrives with HR in the 200's ,

## 2022-09-01 DIAGNOSIS — J3089 Other allergic rhinitis: Secondary | ICD-10-CM | POA: Diagnosis not present

## 2022-09-01 DIAGNOSIS — J3081 Allergic rhinitis due to animal (cat) (dog) hair and dander: Secondary | ICD-10-CM | POA: Diagnosis not present

## 2022-09-01 DIAGNOSIS — J301 Allergic rhinitis due to pollen: Secondary | ICD-10-CM | POA: Diagnosis not present

## 2022-09-04 DIAGNOSIS — J301 Allergic rhinitis due to pollen: Secondary | ICD-10-CM | POA: Diagnosis not present

## 2022-09-04 DIAGNOSIS — J3089 Other allergic rhinitis: Secondary | ICD-10-CM | POA: Diagnosis not present

## 2022-09-04 DIAGNOSIS — J3081 Allergic rhinitis due to animal (cat) (dog) hair and dander: Secondary | ICD-10-CM | POA: Diagnosis not present

## 2022-09-07 DIAGNOSIS — J3081 Allergic rhinitis due to animal (cat) (dog) hair and dander: Secondary | ICD-10-CM | POA: Diagnosis not present

## 2022-09-07 DIAGNOSIS — J3089 Other allergic rhinitis: Secondary | ICD-10-CM | POA: Diagnosis not present

## 2022-09-07 DIAGNOSIS — J301 Allergic rhinitis due to pollen: Secondary | ICD-10-CM | POA: Diagnosis not present

## 2022-09-09 DIAGNOSIS — R000 Tachycardia, unspecified: Secondary | ICD-10-CM | POA: Diagnosis not present

## 2022-09-09 DIAGNOSIS — G43109 Migraine with aura, not intractable, without status migrainosus: Secondary | ICD-10-CM | POA: Diagnosis not present

## 2022-09-09 DIAGNOSIS — E039 Hypothyroidism, unspecified: Secondary | ICD-10-CM | POA: Diagnosis not present

## 2022-09-09 DIAGNOSIS — Z1211 Encounter for screening for malignant neoplasm of colon: Secondary | ICD-10-CM | POA: Diagnosis not present

## 2022-09-10 DIAGNOSIS — J3089 Other allergic rhinitis: Secondary | ICD-10-CM | POA: Diagnosis not present

## 2022-09-10 DIAGNOSIS — J301 Allergic rhinitis due to pollen: Secondary | ICD-10-CM | POA: Diagnosis not present

## 2022-09-10 DIAGNOSIS — J3081 Allergic rhinitis due to animal (cat) (dog) hair and dander: Secondary | ICD-10-CM | POA: Diagnosis not present

## 2022-09-15 DIAGNOSIS — J301 Allergic rhinitis due to pollen: Secondary | ICD-10-CM | POA: Diagnosis not present

## 2022-09-15 DIAGNOSIS — J3089 Other allergic rhinitis: Secondary | ICD-10-CM | POA: Diagnosis not present

## 2022-09-16 ENCOUNTER — Ambulatory Visit: Payer: BC Managed Care – PPO | Admitting: Internal Medicine

## 2022-09-17 DIAGNOSIS — J3089 Other allergic rhinitis: Secondary | ICD-10-CM | POA: Diagnosis not present

## 2022-09-17 DIAGNOSIS — J301 Allergic rhinitis due to pollen: Secondary | ICD-10-CM | POA: Diagnosis not present

## 2022-09-22 DIAGNOSIS — J3089 Other allergic rhinitis: Secondary | ICD-10-CM | POA: Diagnosis not present

## 2022-09-22 DIAGNOSIS — J3081 Allergic rhinitis due to animal (cat) (dog) hair and dander: Secondary | ICD-10-CM | POA: Diagnosis not present

## 2022-09-22 DIAGNOSIS — J301 Allergic rhinitis due to pollen: Secondary | ICD-10-CM | POA: Diagnosis not present

## 2022-09-24 DIAGNOSIS — J3081 Allergic rhinitis due to animal (cat) (dog) hair and dander: Secondary | ICD-10-CM | POA: Diagnosis not present

## 2022-09-24 DIAGNOSIS — J301 Allergic rhinitis due to pollen: Secondary | ICD-10-CM | POA: Diagnosis not present

## 2022-09-24 DIAGNOSIS — J3089 Other allergic rhinitis: Secondary | ICD-10-CM | POA: Diagnosis not present

## 2022-09-28 NOTE — Progress Notes (Unsigned)
Cardiology Office Note:    Date:  10/01/2022   ID:  Kathryn Taylor, DOB 05-29-1961, MRN [id number]  PCP:  Kathie Dike, MD   [facility] Providers Cardiologist:  None   Referring MD: Lawerance Cruel, MD    History of Present Illness:    Kathryn Taylor is a 62 y.o. female with a hx of hypothyroidism, prior PE, depression and episode of SVT who was referred by Dr. Harrington Challenger for further evaluation of palpitations.  Patient seen by Dr. Harrington Challenger on 09/11/22. Notes reviewed. Patient with episode of SVT with HR >200bpm prompting ER visit. She was given "3 rounds of adenosine" and converted back to NSR. She is now referred to Cardiology for further management.  Today, the patient states that she initially noticed palpitations about a year ago. Symptoms were initially brief and did not sustain until she had the episode of SVT with HR in the 200s in 08/2021 as detailed above. During that episode, her symptoms lasted a total of 72mnutes before it broke with adenosine. Since her ER visit, she had one brief episode of palpitations that abated after a couple of seconds. Otherwise, she is doing well. No chest pain, SOB, LE edema, orthopnea, or PND. Blood pressure is well controlled not on medications. TSH is normal.   Past Medical History:  Diagnosis Date   Connective tissue disease ([place]    Depression    History of pulmonary embolus (PE)    Hypothyroidism    PE (pulmonary embolism) 2006   Tachycardia    Thyroid disease     Past Surgical History:  Procedure Laterality Date   ABDOMINAL HYSTERECTOMY      Current Medications: Current Meds  Medication Sig   CALCIUM CARBONATE-VITAMIN D PO Take 1 tablet by mouth daily.   citalopram (CELEXA) 20 MG tablet Take 20 mg by mouth daily.   Cyanocobalamin (B-12) 1000 MCG SUBL Place 1,000 mcg under the tongue daily.   fluticasone (FLONASE) 50 MCG/ACT nasal spray Place 2 sprays into the nose daily as needed for allergies.    hydroxychloroquine  (PLAQUENIL) 200 MG tablet Take 400 mg by mouth at bedtime.    levothyroxine (SYNTHROID, LEVOTHROID) 125 MCG tablet Take 125 mcg by mouth daily.   meloxicam (MOBIC) 15 MG tablet Take 15 mg by mouth daily.   methocarbamol (ROBAXIN) 500 MG tablet Take 500 mg by mouth as needed.   metoprolol tartrate (LOPRESSOR) 25 MG tablet Take 1 tablet (25 mg total) by mouth 2 (two) times daily as needed (palpitations).   Multiple Vitamins-Minerals (MULTIVITAMIN ADULT PO) Take 1 tablet by mouth daily.   traMADol (ULTRAM) 50 MG tablet Take 50 mg by mouth as needed.   traZODone (DESYREL) 100 MG tablet Take 100 mg by mouth at bedtime.     Allergies:   Erythromycin, Morphine and related, Penicillins, and Sulfa antibiotics   Social History   Socioeconomic History   Marital status: Married    Spouse name: Not on file   Number of children: Not on file   Years of education: Not on file   Highest education level: Not on file  Occupational History   Not on file  Tobacco Use   Smoking status: Never   Smokeless tobacco: Never  Vaping Use   Vaping Use: Never used  Substance and Sexual Activity   Alcohol use: Yes    Alcohol/week: 12.0 standard drinks of alcohol    Types: 12 Glasses of wine per week    Comment: 3-5x per week  Drug use: Never   Sexual activity: Not on file  Other Topics Concern   Not on file  Social History Narrative   Not on file   Social Determinants of Health   Financial Resource Strain: Not on file  Food Insecurity: Not on file  Transportation Needs: Not on file  Physical Activity: Not on file  Stress: Not on file  Social Connections: Not on file     Family History: The patient's family history includes Heart attack in her brother.  ROS:   Please see the history of present illness.     All other systems reviewed and are negative.  EKGs/Labs/Other Studies Reviewed:    The following studies were reviewed today: No CV studies  EKG:  EKG is  ordered today.  The ekg  ordered today demonstrates NSR with HR 72bpm  Recent Labs: 08/26/2022: BUN 22; Creatinine, Ser 1.01; Hemoglobin 14.0; Platelets 251; Potassium 3.4; Sodium 137; TSH 0.717  Recent Lipid Panel No results found for: "CHOL", "TRIG", "HDL", "CHOLHDL", "VLDL", "LDLCALC", "LDLDIRECT"   Risk Assessment/Calculations:                Physical Exam:    VS:  BP 121/86   Pulse 72   Ht 5' 4"$  (1.626 m)   Wt 162 lb 12.8 oz (73.8 kg)   SpO2 98%   BMI 27.94 kg/m     Wt Readings from Last 3 Encounters:  10/01/22 162 lb 12.8 oz (73.8 kg)  11/17/18 160 lb (72.6 kg)  12/22/16 167 lb 12.8 oz (76.1 kg)     GEN:  Well nourished, well developed in no acute distress HEENT: Normal NECK: No JVD; No carotid bruits CARDIAC: RRR, no murmurs, rubs, gallops RESPIRATORY:  Clear to auscultation without rales, wheezing or rhonchi  ABDOMEN: Soft, non-tender, non-distended MUSCULOSKELETAL:  No edema; No deformity  SKIN: Warm and dry NEUROLOGIC:  Alert and oriented x 3 PSYCHIATRIC:  Normal affect   ASSESSMENT:    1. SVT (supraventricular tachycardia)   2. Palpitations   3. Family history of early CAD    PLAN:    In order of problems listed above:  #SVT: Patient presented to [facility] ER with episode of palpitations with HR >200 with SVT noted on ECG. She received adenosine x3 doses with return to NSR. Today, she is doing well with no recurrence of symptoms. Discussed options of medical management vs ablation and patient wishes to proceed with medical management at this time. Will manage as below -Start metop 38m BID prn for palpitations -Check 3 day zio -Check TTE -TSH within normal range -If fails medical therapy, can refer to EP for consideration of an ablation  #Family History of CAD: #CV screening: -Check Ca score for screening      Medication Adjustments/Labs and Tests Ordered: Current medicines are reviewed at length with the patient today.  Concerns regarding medicines are outlined above.   Orders Placed This Encounter  Procedures   CT CARDIAC SCORING (SELF PAY ONLY)   LONG TERM MONITOR (3-14 DAYS)   EKG 12-Lead   ECHOCARDIOGRAM COMPLETE   Meds ordered this encounter  Medications   metoprolol tartrate (LOPRESSOR) 25 MG tablet    Sig: Take 1 tablet (25 mg total) by mouth 2 (two) times daily as needed (palpitations).    Dispense:  60 tablet    Refill:  4    Patient Instructions  Medication Instructions:   START TAKING METOPROLOL TARTRATE 25 MG BY MOUTH TWICE DAILY AS NEEDED FOR PALPITATIONS  *  If you need a refill on your cardiac medications before your next appointment, please call your pharmacy*    Testing/Procedures:  Your physician has requested that you have an echocardiogram. Echocardiography is a painless test that uses sound waves to create images of your heart. It provides your doctor with information about the size and shape of your heart and how well your heart's chambers and valves are working. This procedure takes approximately one hour. There are no restrictions for this procedure. Please do NOT wear cologne, perfume, aftershave, or lotions (deodorant is allowed). Please arrive 15 minutes prior to your appointment time.   CARDIAC CALCIUM SCORE (SELF PAY)    ZIO XT- Long Term Monitor Instructions  Your physician has requested you wear a ZIO patch monitor for 3 days.  This is a single patch monitor. [employer] supplies one patch monitor per enrollment. Additional stickers are not available. Please do not apply patch if you will be having a Nuclear Stress Test,  Echocardiogram, Cardiac CT, MRI, or Chest Xray during the period you would be wearing the  monitor. The patch cannot be worn during these tests. You cannot remove and re-apply the  ZIO XT patch monitor.  Your ZIO patch monitor will be mailed 3 day USPS to your address on file. It may take 3-5 days  to receive your monitor after you have been enrolled.  Once you have received your monitor,  please review the enclosed instructions. Your monitor  has already been registered assigning a specific monitor serial # to you.  Billing and Patient Assistance Program Information  We have supplied [employer] with any of your insurance information on file for billing purposes. [employer] offers a sliding scale Patient Assistance Program for patients that do not have  insurance, or whose insurance does not completely cover the cost of the ZIO monitor.  You must apply for the Patient Assistance Program to qualify for this discounted rate.  To apply, please call [employer] at [phone], select option 4, select option 2, ask to apply for  Patient Assistance Program. Theodore Demark will ask your household income, and how many people  are in your household. They will quote your out-of-pocket cost based on that information.  [employer] will also be able to set up a 78-month interest-free payment plan if needed.  Applying the monitor   Shave hair from upper left chest.  Hold abrader disc by orange tab. Rub abrader in 40 strokes over the upper left chest as  indicated in your monitor instructions.  Clean area with 4 enclosed alcohol pads. Let dry.  Apply patch as indicated in monitor instructions. Patch will be placed under collarbone on left  side of chest with arrow pointing upward.  Rub patch adhesive wings for 2 minutes. Remove white label marked "1". Remove the white  label marked "2". Rub patch adhesive wings for 2 additional minutes.  While looking in a mirror, press and release button in center of patch. A small green light will  flash 3-4 times. This will be your only indicator that the monitor has been turned on.  Do not shower for the first 24 hours. You may shower after the first 24 hours.  Press the button if you feel a symptom. You will hear a small click. Record Date, Time and  Symptom in the Patient Logbook.  When you are ready to remove the patch, follow instructions on the last 2 pages of  Patient  Logbook. Stick patch monitor onto the last page of Patient Logbook.  Place Patient Logbook in the blue and white box. Use locking tab on box and tape box closed  securely. The blue and white box has prepaid postage on it. Please place it in the mailbox as  soon as possible. Your physician should have your test results approximately 7 days after the  monitor has been mailed back to [facility].  Call [place] at [phone] if you have questions regarding  your ZIO XT patch monitor. Call them immediately if you see an orange light blinking on your  monitor.  If your monitor falls off in less than 4 days, contact our Monitor department at [phone].  If your monitor becomes loose or falls off after 4 days call [employer] at [phone] for  suggestions on securing your monitor   Follow-Up:  IN MAY 2024 WITH AN EXTENDER IN THE OFFICE--PLEASE SCHEDULE WITH AN EXTENDER PER DR. Johney Frame     Signed, Freada Bergeron, MD  10/01/2022 4:59 PM    [employer]

## 2022-10-01 ENCOUNTER — Encounter: Payer: Self-pay | Admitting: Cardiology

## 2022-10-01 ENCOUNTER — Ambulatory Visit (INDEPENDENT_AMBULATORY_CARE_PROVIDER_SITE_OTHER): Payer: BC Managed Care – PPO

## 2022-10-01 ENCOUNTER — Telehealth: Payer: Self-pay | Admitting: *Deleted

## 2022-10-01 ENCOUNTER — Ambulatory Visit: Payer: BC Managed Care – PPO | Attending: Cardiology | Admitting: Cardiology

## 2022-10-01 VITALS — BP 121/86 | HR 72 | Ht 64.0 in | Wt 162.8 lb

## 2022-10-01 DIAGNOSIS — R002 Palpitations: Secondary | ICD-10-CM

## 2022-10-01 DIAGNOSIS — I471 Supraventricular tachycardia, unspecified: Secondary | ICD-10-CM | POA: Diagnosis not present

## 2022-10-01 DIAGNOSIS — Z8249 Family history of ischemic heart disease and other diseases of the circulatory system: Secondary | ICD-10-CM

## 2022-10-01 MED ORDER — METOPROLOL TARTRATE 25 MG PO TABS: 25.0000 mg | ORAL_TABLET | Freq: Two times a day (BID) | ORAL | 4 refills | Status: AC | PRN

## 2022-10-01 NOTE — Patient Instructions (Signed)
Medication Instructions:   START TAKING METOPROLOL TARTRATE 25 MG BY MOUTH TWICE DAILY AS NEEDED FOR PALPITATIONS  *If you need a refill on your cardiac medications before your next appointment, please call your pharmacy*    Testing/Procedures:  Your physician has requested that you have an echocardiogram. Echocardiography is a painless test that uses sound waves to create images of your heart. It provides your doctor with information about the size and shape of your heart and how well your heart's chambers and valves are working. This procedure takes approximately one hour. There are no restrictions for this procedure. Please do NOT wear cologne, perfume, aftershave, or lotions (deodorant is allowed). Please arrive 15 minutes prior to your appointment time.   CARDIAC CALCIUM SCORE (SELF PAY)    ZIO XT- Long Term Monitor Instructions  Your physician has requested you wear a ZIO patch monitor for 3 days.  This is a single patch monitor. [employer] supplies one patch monitor per enrollment. Additional stickers are not available. Please do not apply patch if you will be having a Nuclear Stress Test,  Echocardiogram, Cardiac CT, MRI, or Chest Xray during the period you would be wearing the  monitor. The patch cannot be worn during these tests. You cannot remove and re-apply the  ZIO XT patch monitor.  Your ZIO patch monitor will be mailed 3 day USPS to your address on file. It may take 3-5 days  to receive your monitor after you have been enrolled.  Once you have received your monitor, please review the enclosed instructions. Your monitor  has already been registered assigning a specific monitor serial # to you.  Billing and Patient Assistance Program Information  We have supplied [employer] with any of your insurance information on file for billing purposes. [employer] offers a sliding scale Patient Assistance Program for patients that do not have  insurance, or whose insurance does not  completely cover the cost of the ZIO monitor.  You must apply for the Patient Assistance Program to qualify for this discounted rate.  To apply, please call [employer] at [phone], select option 4, select option 2, ask to apply for  Patient Assistance Program. Theodore Demark will ask your household income, and how many people  are in your household. They will quote your out-of-pocket cost based on that information.  [employer] will also be able to set up a 58-month interest-free payment plan if needed.  Applying the monitor   Shave hair from upper left chest.  Hold abrader disc by orange tab. Rub abrader in 40 strokes over the upper left chest as  indicated in your monitor instructions.  Clean area with 4 enclosed alcohol pads. Let dry.  Apply patch as indicated in monitor instructions. Patch will be placed under collarbone on left  side of chest with arrow pointing upward.  Rub patch adhesive wings for 2 minutes. Remove white label marked "1". Remove the white  label marked "2". Rub patch adhesive wings for 2 additional minutes.  While looking in a mirror, press and release button in center of patch. A small green light will  flash 3-4 times. This will be your only indicator that the monitor has been turned on.  Do not shower for the first 24 hours. You may shower after the first 24 hours.  Press the button if you feel a symptom. You will hear a small click. Record Date, Time and  Symptom in the Patient Logbook.  When you are ready to remove the patch, follow instructions on  the last 2 pages of Patient  Logbook. Stick patch monitor onto the last page of Patient Logbook.  Place Patient Logbook in the blue and white box. Use locking tab on box and tape box closed  securely. The blue and white box has prepaid postage on it. Please place it in the mailbox as  soon as possible. Your physician should have your test results approximately 7 days after the  monitor has been mailed back to [facility].  Call  [facility] at [phone] if you have questions regarding  your ZIO XT patch monitor. Call them immediately if you see an orange light blinking on your  monitor.  If your monitor falls off in less than 4 days, contact our Monitor department at [phone].  If your monitor becomes loose or falls off after 4 days call [employer] at [phone] for  suggestions on securing your monitor   Follow-Up:  IN MAY 2024 WITH AN EXTENDER IN THE OFFICE--PLEASE SCHEDULE WITH AN EXTENDER PER DR. Johney Frame

## 2022-10-01 NOTE — Progress Notes (Unsigned)
Enrolled patient for a 3 day ZIo XT monitor to be mailed to patients home

## 2022-10-01 NOTE — Telephone Encounter (Signed)
-----   Message from Gerarda Gunther sent at 10/01/2022  4:17 PM EST ----- Regarding: RE: 3 DAY ZIO PER DR. Johney Frame done ----- Message ----- From: Nuala Alpha, LPN Sent: [id number]   4:15 PM EST To: Jennefer Bravo; Katrina Cloyd Stagers Subject: 3 DAY ZIO PER DR. Johney Frame                    Dr. Johney Frame ordered a 3 day zio for palpitations   Please enroll and let me know  Thanks Karlene Einstein

## 2022-10-02 DIAGNOSIS — Z23 Encounter for immunization: Secondary | ICD-10-CM | POA: Diagnosis not present

## 2022-10-06 DIAGNOSIS — J3089 Other allergic rhinitis: Secondary | ICD-10-CM | POA: Diagnosis not present

## 2022-10-06 DIAGNOSIS — J301 Allergic rhinitis due to pollen: Secondary | ICD-10-CM | POA: Diagnosis not present

## 2022-10-06 DIAGNOSIS — J3081 Allergic rhinitis due to animal (cat) (dog) hair and dander: Secondary | ICD-10-CM | POA: Diagnosis not present

## 2022-10-08 DIAGNOSIS — M25571 Pain in right ankle and joints of right foot: Secondary | ICD-10-CM | POA: Diagnosis not present

## 2022-10-08 DIAGNOSIS — M351 Other overlap syndromes: Secondary | ICD-10-CM | POA: Diagnosis not present

## 2022-10-08 DIAGNOSIS — G8929 Other chronic pain: Secondary | ICD-10-CM | POA: Diagnosis not present

## 2022-10-08 DIAGNOSIS — M19279 Secondary osteoarthritis, unspecified ankle and foot: Secondary | ICD-10-CM | POA: Diagnosis not present

## 2022-10-08 DIAGNOSIS — M19071 Primary osteoarthritis, right ankle and foot: Secondary | ICD-10-CM | POA: Diagnosis not present

## 2022-10-08 DIAGNOSIS — Z981 Arthrodesis status: Secondary | ICD-10-CM | POA: Diagnosis not present

## 2022-10-13 DIAGNOSIS — R002 Palpitations: Secondary | ICD-10-CM | POA: Diagnosis not present

## 2022-10-13 DIAGNOSIS — J3089 Other allergic rhinitis: Secondary | ICD-10-CM | POA: Diagnosis not present

## 2022-10-13 DIAGNOSIS — M359 Systemic involvement of connective tissue, unspecified: Secondary | ICD-10-CM | POA: Diagnosis not present

## 2022-10-13 DIAGNOSIS — L932 Other local lupus erythematosus: Secondary | ICD-10-CM | POA: Diagnosis not present

## 2022-10-13 DIAGNOSIS — M1991 Primary osteoarthritis, unspecified site: Secondary | ICD-10-CM | POA: Diagnosis not present

## 2022-10-13 DIAGNOSIS — I471 Supraventricular tachycardia, unspecified: Secondary | ICD-10-CM

## 2022-10-13 DIAGNOSIS — M25562 Pain in left knee: Secondary | ICD-10-CM | POA: Diagnosis not present

## 2022-10-13 DIAGNOSIS — J3081 Allergic rhinitis due to animal (cat) (dog) hair and dander: Secondary | ICD-10-CM | POA: Diagnosis not present

## 2022-10-13 DIAGNOSIS — J301 Allergic rhinitis due to pollen: Secondary | ICD-10-CM | POA: Diagnosis not present

## 2022-10-19 DIAGNOSIS — J3081 Allergic rhinitis due to animal (cat) (dog) hair and dander: Secondary | ICD-10-CM | POA: Diagnosis not present

## 2022-10-19 DIAGNOSIS — J3089 Other allergic rhinitis: Secondary | ICD-10-CM | POA: Diagnosis not present

## 2022-10-19 DIAGNOSIS — M25562 Pain in left knee: Secondary | ICD-10-CM | POA: Diagnosis not present

## 2022-10-19 DIAGNOSIS — J301 Allergic rhinitis due to pollen: Secondary | ICD-10-CM | POA: Diagnosis not present

## 2022-10-22 DIAGNOSIS — M25571 Pain in right ankle and joints of right foot: Secondary | ICD-10-CM | POA: Diagnosis not present

## 2022-10-22 DIAGNOSIS — M159 Polyosteoarthritis, unspecified: Secondary | ICD-10-CM | POA: Diagnosis not present

## 2022-10-22 DIAGNOSIS — J3089 Other allergic rhinitis: Secondary | ICD-10-CM | POA: Diagnosis not present

## 2022-10-22 DIAGNOSIS — M351 Other overlap syndromes: Secondary | ICD-10-CM | POA: Diagnosis not present

## 2022-10-22 DIAGNOSIS — G8929 Other chronic pain: Secondary | ICD-10-CM | POA: Diagnosis not present

## 2022-10-22 DIAGNOSIS — J301 Allergic rhinitis due to pollen: Secondary | ICD-10-CM | POA: Diagnosis not present

## 2022-10-22 DIAGNOSIS — M19071 Primary osteoarthritis, right ankle and foot: Secondary | ICD-10-CM | POA: Diagnosis not present

## 2022-10-22 DIAGNOSIS — J3081 Allergic rhinitis due to animal (cat) (dog) hair and dander: Secondary | ICD-10-CM | POA: Diagnosis not present

## 2022-10-22 DIAGNOSIS — Z79899 Other long term (current) drug therapy: Secondary | ICD-10-CM | POA: Diagnosis not present

## 2022-10-23 DIAGNOSIS — R002 Palpitations: Secondary | ICD-10-CM | POA: Diagnosis not present

## 2022-10-23 DIAGNOSIS — I471 Supraventricular tachycardia, unspecified: Secondary | ICD-10-CM | POA: Diagnosis not present

## 2022-10-26 DIAGNOSIS — J3081 Allergic rhinitis due to animal (cat) (dog) hair and dander: Secondary | ICD-10-CM | POA: Diagnosis not present

## 2022-10-26 DIAGNOSIS — J301 Allergic rhinitis due to pollen: Secondary | ICD-10-CM | POA: Diagnosis not present

## 2022-10-26 DIAGNOSIS — J3089 Other allergic rhinitis: Secondary | ICD-10-CM | POA: Diagnosis not present

## 2022-10-28 DIAGNOSIS — M25562 Pain in left knee: Secondary | ICD-10-CM | POA: Diagnosis not present

## 2022-10-29 DIAGNOSIS — J3089 Other allergic rhinitis: Secondary | ICD-10-CM | POA: Diagnosis not present

## 2022-10-29 DIAGNOSIS — J301 Allergic rhinitis due to pollen: Secondary | ICD-10-CM | POA: Diagnosis not present

## 2022-10-29 DIAGNOSIS — J3081 Allergic rhinitis due to animal (cat) (dog) hair and dander: Secondary | ICD-10-CM | POA: Diagnosis not present

## 2022-11-03 DIAGNOSIS — M25562 Pain in left knee: Secondary | ICD-10-CM | POA: Diagnosis not present

## 2022-11-03 DIAGNOSIS — J3089 Other allergic rhinitis: Secondary | ICD-10-CM | POA: Diagnosis not present

## 2022-11-03 DIAGNOSIS — J301 Allergic rhinitis due to pollen: Secondary | ICD-10-CM | POA: Diagnosis not present

## 2022-11-05 ENCOUNTER — Ambulatory Visit (HOSPITAL_BASED_OUTPATIENT_CLINIC_OR_DEPARTMENT_OTHER)
Admission: RE | Admit: 2022-11-05 | Discharge: 2022-11-05 | Disposition: A | Discharge status: Home / Self Care | Payer: Self-pay | Source: Ambulatory Visit | Attending: Cardiology | Admitting: Cardiology

## 2022-11-05 ENCOUNTER — Ambulatory Visit (HOSPITAL_COMMUNITY): Payer: BC Managed Care – PPO | Attending: Cardiology

## 2022-11-05 ENCOUNTER — Encounter (HOSPITAL_BASED_OUTPATIENT_CLINIC_OR_DEPARTMENT_OTHER): Payer: Self-pay

## 2022-11-05 DIAGNOSIS — I471 Supraventricular tachycardia, unspecified: Secondary | ICD-10-CM

## 2022-11-05 DIAGNOSIS — R002 Palpitations: Secondary | ICD-10-CM | POA: Insufficient documentation

## 2022-11-05 DIAGNOSIS — Z8249 Family history of ischemic heart disease and other diseases of the circulatory system: Secondary | ICD-10-CM | POA: Insufficient documentation

## 2022-11-05 LAB — ECHOCARDIOGRAM COMPLETE
Area-P 1/2: 4.23 cm2
P 1/2 time: 342 msec
S' Lateral: 2.9 cm

## 2022-11-06 ENCOUNTER — Encounter: Payer: Self-pay | Admitting: Cardiology

## 2022-11-09 ENCOUNTER — Encounter: Payer: Self-pay | Admitting: *Deleted

## 2022-11-09 DIAGNOSIS — J301 Allergic rhinitis due to pollen: Secondary | ICD-10-CM | POA: Diagnosis not present

## 2022-11-10 DIAGNOSIS — J301 Allergic rhinitis due to pollen: Secondary | ICD-10-CM | POA: Diagnosis not present

## 2022-11-10 DIAGNOSIS — J3089 Other allergic rhinitis: Secondary | ICD-10-CM | POA: Diagnosis not present

## 2022-11-10 DIAGNOSIS — J3081 Allergic rhinitis due to animal (cat) (dog) hair and dander: Secondary | ICD-10-CM | POA: Diagnosis not present

## 2022-11-17 DIAGNOSIS — J3089 Other allergic rhinitis: Secondary | ICD-10-CM | POA: Diagnosis not present

## 2022-11-17 DIAGNOSIS — J301 Allergic rhinitis due to pollen: Secondary | ICD-10-CM | POA: Diagnosis not present

## 2022-11-18 DIAGNOSIS — R000 Tachycardia, unspecified: Secondary | ICD-10-CM | POA: Diagnosis not present

## 2022-11-18 DIAGNOSIS — Z0000 Encounter for general adult medical examination without abnormal findings: Secondary | ICD-10-CM | POA: Diagnosis not present

## 2022-11-18 DIAGNOSIS — E538 Deficiency of other specified B group vitamins: Secondary | ICD-10-CM | POA: Diagnosis not present

## 2022-11-18 DIAGNOSIS — E039 Hypothyroidism, unspecified: Secondary | ICD-10-CM | POA: Diagnosis not present

## 2022-11-18 DIAGNOSIS — Z13220 Encounter for screening for lipoid disorders: Secondary | ICD-10-CM | POA: Diagnosis not present

## 2022-11-24 DIAGNOSIS — J3089 Other allergic rhinitis: Secondary | ICD-10-CM | POA: Diagnosis not present

## 2022-11-24 DIAGNOSIS — J301 Allergic rhinitis due to pollen: Secondary | ICD-10-CM | POA: Diagnosis not present

## 2022-11-25 DIAGNOSIS — G479 Sleep disorder, unspecified: Secondary | ICD-10-CM | POA: Diagnosis not present

## 2022-11-25 DIAGNOSIS — E538 Deficiency of other specified B group vitamins: Secondary | ICD-10-CM | POA: Diagnosis not present

## 2022-11-25 DIAGNOSIS — D510 Vitamin B12 deficiency anemia due to intrinsic factor deficiency: Secondary | ICD-10-CM | POA: Diagnosis not present

## 2022-11-25 DIAGNOSIS — Z0000 Encounter for general adult medical examination without abnormal findings: Secondary | ICD-10-CM | POA: Diagnosis not present

## 2022-11-25 DIAGNOSIS — E039 Hypothyroidism, unspecified: Secondary | ICD-10-CM | POA: Diagnosis not present

## 2022-11-27 DIAGNOSIS — Z86711 Personal history of pulmonary embolism: Secondary | ICD-10-CM | POA: Diagnosis not present

## 2022-11-27 DIAGNOSIS — Z130 Encounter for screening for diseases of the blood and blood-forming organs and certain disorders involving the immune mechanism: Secondary | ICD-10-CM | POA: Diagnosis not present

## 2022-11-27 DIAGNOSIS — Z7901 Long term (current) use of anticoagulants: Secondary | ICD-10-CM | POA: Diagnosis not present

## 2022-12-01 DIAGNOSIS — E039 Hypothyroidism, unspecified: Secondary | ICD-10-CM | POA: Diagnosis not present

## 2022-12-01 DIAGNOSIS — I471 Supraventricular tachycardia, unspecified: Secondary | ICD-10-CM | POA: Diagnosis not present

## 2022-12-01 DIAGNOSIS — Z01818 Encounter for other preprocedural examination: Secondary | ICD-10-CM | POA: Diagnosis not present

## 2022-12-01 DIAGNOSIS — I2699 Other pulmonary embolism without acute cor pulmonale: Secondary | ICD-10-CM | POA: Diagnosis not present

## 2022-12-01 DIAGNOSIS — M359 Systemic involvement of connective tissue, unspecified: Secondary | ICD-10-CM | POA: Diagnosis not present

## 2022-12-04 DIAGNOSIS — J3081 Allergic rhinitis due to animal (cat) (dog) hair and dander: Secondary | ICD-10-CM | POA: Diagnosis not present

## 2022-12-04 DIAGNOSIS — J301 Allergic rhinitis due to pollen: Secondary | ICD-10-CM | POA: Diagnosis not present

## 2022-12-04 DIAGNOSIS — J3089 Other allergic rhinitis: Secondary | ICD-10-CM | POA: Diagnosis not present

## 2022-12-14 DIAGNOSIS — J3089 Other allergic rhinitis: Secondary | ICD-10-CM | POA: Diagnosis not present

## 2022-12-14 DIAGNOSIS — J301 Allergic rhinitis due to pollen: Secondary | ICD-10-CM | POA: Diagnosis not present

## 2022-12-14 DIAGNOSIS — J3081 Allergic rhinitis due to animal (cat) (dog) hair and dander: Secondary | ICD-10-CM | POA: Diagnosis not present

## 2022-12-16 DIAGNOSIS — M25771 Osteophyte, right ankle: Secondary | ICD-10-CM | POA: Diagnosis not present

## 2022-12-16 DIAGNOSIS — E039 Hypothyroidism, unspecified: Secondary | ICD-10-CM | POA: Diagnosis not present

## 2022-12-16 DIAGNOSIS — Z881 Allergy status to other antibiotic agents status: Secondary | ICD-10-CM | POA: Diagnosis not present

## 2022-12-16 DIAGNOSIS — M25871 Other specified joint disorders, right ankle and foot: Secondary | ICD-10-CM | POA: Diagnosis not present

## 2022-12-16 DIAGNOSIS — Z885 Allergy status to narcotic agent status: Secondary | ICD-10-CM | POA: Diagnosis not present

## 2022-12-16 DIAGNOSIS — T8482XA Fibrosis due to internal orthopedic prosthetic devices, implants and grafts, initial encounter: Secondary | ICD-10-CM | POA: Diagnosis not present

## 2022-12-16 DIAGNOSIS — Z79890 Hormone replacement therapy (postmenopausal): Secondary | ICD-10-CM | POA: Diagnosis not present

## 2022-12-16 DIAGNOSIS — Z86711 Personal history of pulmonary embolism: Secondary | ICD-10-CM | POA: Diagnosis not present

## 2022-12-16 DIAGNOSIS — G8918 Other acute postprocedural pain: Secondary | ICD-10-CM | POA: Diagnosis not present

## 2022-12-16 DIAGNOSIS — Z880 Allergy status to penicillin: Secondary | ICD-10-CM | POA: Diagnosis not present

## 2022-12-16 DIAGNOSIS — Z882 Allergy status to sulfonamides status: Secondary | ICD-10-CM | POA: Diagnosis not present

## 2022-12-16 DIAGNOSIS — M19071 Primary osteoarthritis, right ankle and foot: Secondary | ICD-10-CM | POA: Diagnosis not present

## 2022-12-16 HISTORY — PX: ANKLE SURGERY: SHX546

## 2022-12-22 DIAGNOSIS — J301 Allergic rhinitis due to pollen: Secondary | ICD-10-CM | POA: Diagnosis not present

## 2022-12-22 DIAGNOSIS — J3089 Other allergic rhinitis: Secondary | ICD-10-CM | POA: Diagnosis not present

## 2022-12-25 DIAGNOSIS — M19071 Primary osteoarthritis, right ankle and foot: Secondary | ICD-10-CM | POA: Diagnosis not present

## 2022-12-30 DIAGNOSIS — J3081 Allergic rhinitis due to animal (cat) (dog) hair and dander: Secondary | ICD-10-CM | POA: Diagnosis not present

## 2022-12-30 DIAGNOSIS — J3089 Other allergic rhinitis: Secondary | ICD-10-CM | POA: Diagnosis not present

## 2022-12-30 DIAGNOSIS — J301 Allergic rhinitis due to pollen: Secondary | ICD-10-CM | POA: Diagnosis not present

## 2023-01-05 DIAGNOSIS — J3081 Allergic rhinitis due to animal (cat) (dog) hair and dander: Secondary | ICD-10-CM | POA: Diagnosis not present

## 2023-01-05 DIAGNOSIS — J3089 Other allergic rhinitis: Secondary | ICD-10-CM | POA: Diagnosis not present

## 2023-01-05 DIAGNOSIS — J301 Allergic rhinitis due to pollen: Secondary | ICD-10-CM | POA: Diagnosis not present

## 2023-01-07 ENCOUNTER — Ambulatory Visit: Payer: BC Managed Care – PPO | Admitting: Nurse Practitioner

## 2023-01-08 DIAGNOSIS — T8484XA Pain due to internal orthopedic prosthetic devices, implants and grafts, initial encounter: Secondary | ICD-10-CM | POA: Diagnosis not present

## 2023-01-08 DIAGNOSIS — M19071 Primary osteoarthritis, right ankle and foot: Secondary | ICD-10-CM | POA: Diagnosis not present

## 2023-01-12 DIAGNOSIS — J301 Allergic rhinitis due to pollen: Secondary | ICD-10-CM | POA: Diagnosis not present

## 2023-01-12 DIAGNOSIS — J3089 Other allergic rhinitis: Secondary | ICD-10-CM | POA: Diagnosis not present

## 2023-01-19 DIAGNOSIS — J3089 Other allergic rhinitis: Secondary | ICD-10-CM | POA: Diagnosis not present

## 2023-01-19 DIAGNOSIS — J301 Allergic rhinitis due to pollen: Secondary | ICD-10-CM | POA: Diagnosis not present

## 2023-01-26 DIAGNOSIS — J301 Allergic rhinitis due to pollen: Secondary | ICD-10-CM | POA: Diagnosis not present

## 2023-01-26 DIAGNOSIS — J3081 Allergic rhinitis due to animal (cat) (dog) hair and dander: Secondary | ICD-10-CM | POA: Diagnosis not present

## 2023-01-26 DIAGNOSIS — J3089 Other allergic rhinitis: Secondary | ICD-10-CM | POA: Diagnosis not present

## 2023-02-01 DIAGNOSIS — J301 Allergic rhinitis due to pollen: Secondary | ICD-10-CM | POA: Diagnosis not present

## 2023-02-01 DIAGNOSIS — J3081 Allergic rhinitis due to animal (cat) (dog) hair and dander: Secondary | ICD-10-CM | POA: Diagnosis not present

## 2023-02-01 DIAGNOSIS — J3089 Other allergic rhinitis: Secondary | ICD-10-CM | POA: Diagnosis not present

## 2023-02-05 DIAGNOSIS — M19071 Primary osteoarthritis, right ankle and foot: Secondary | ICD-10-CM | POA: Diagnosis not present

## 2023-02-05 DIAGNOSIS — Z981 Arthrodesis status: Secondary | ICD-10-CM | POA: Diagnosis not present

## 2023-02-05 DIAGNOSIS — T8484XA Pain due to internal orthopedic prosthetic devices, implants and grafts, initial encounter: Secondary | ICD-10-CM | POA: Diagnosis not present

## 2023-02-11 DIAGNOSIS — J3089 Other allergic rhinitis: Secondary | ICD-10-CM | POA: Diagnosis not present

## 2023-02-11 DIAGNOSIS — J3081 Allergic rhinitis due to animal (cat) (dog) hair and dander: Secondary | ICD-10-CM | POA: Diagnosis not present

## 2023-02-11 DIAGNOSIS — J301 Allergic rhinitis due to pollen: Secondary | ICD-10-CM | POA: Diagnosis not present

## 2023-02-22 DIAGNOSIS — J3081 Allergic rhinitis due to animal (cat) (dog) hair and dander: Secondary | ICD-10-CM | POA: Diagnosis not present

## 2023-02-22 DIAGNOSIS — J301 Allergic rhinitis due to pollen: Secondary | ICD-10-CM | POA: Diagnosis not present

## 2023-02-22 DIAGNOSIS — J3089 Other allergic rhinitis: Secondary | ICD-10-CM | POA: Diagnosis not present

## 2023-03-05 DIAGNOSIS — J301 Allergic rhinitis due to pollen: Secondary | ICD-10-CM | POA: Diagnosis not present

## 2023-03-05 DIAGNOSIS — J3081 Allergic rhinitis due to animal (cat) (dog) hair and dander: Secondary | ICD-10-CM | POA: Diagnosis not present

## 2023-03-05 DIAGNOSIS — J3089 Other allergic rhinitis: Secondary | ICD-10-CM | POA: Diagnosis not present

## 2023-03-12 DIAGNOSIS — J3081 Allergic rhinitis due to animal (cat) (dog) hair and dander: Secondary | ICD-10-CM | POA: Diagnosis not present

## 2023-03-12 DIAGNOSIS — J3089 Other allergic rhinitis: Secondary | ICD-10-CM | POA: Diagnosis not present

## 2023-03-12 DIAGNOSIS — J301 Allergic rhinitis due to pollen: Secondary | ICD-10-CM | POA: Diagnosis not present

## 2023-03-18 DIAGNOSIS — J301 Allergic rhinitis due to pollen: Secondary | ICD-10-CM | POA: Diagnosis not present

## 2023-03-18 DIAGNOSIS — J3089 Other allergic rhinitis: Secondary | ICD-10-CM | POA: Diagnosis not present

## 2023-03-18 DIAGNOSIS — Y838 Other surgical procedures as the cause of abnormal reaction of the patient, or of later complication, without mention of misadventure at the time of the procedure: Secondary | ICD-10-CM | POA: Diagnosis not present

## 2023-03-18 DIAGNOSIS — T8484XA Pain due to internal orthopedic prosthetic devices, implants and grafts, initial encounter: Secondary | ICD-10-CM | POA: Diagnosis not present

## 2023-03-18 DIAGNOSIS — J3081 Allergic rhinitis due to animal (cat) (dog) hair and dander: Secondary | ICD-10-CM | POA: Diagnosis not present

## 2023-03-18 DIAGNOSIS — G5791 Unspecified mononeuropathy of right lower limb: Secondary | ICD-10-CM | POA: Diagnosis not present

## 2023-03-18 DIAGNOSIS — M19071 Primary osteoarthritis, right ankle and foot: Secondary | ICD-10-CM | POA: Diagnosis not present

## 2023-03-18 DIAGNOSIS — Z4789 Encounter for other orthopedic aftercare: Secondary | ICD-10-CM | POA: Diagnosis not present

## 2023-03-18 DIAGNOSIS — Z79899 Other long term (current) drug therapy: Secondary | ICD-10-CM | POA: Diagnosis not present

## 2023-04-01 DIAGNOSIS — J301 Allergic rhinitis due to pollen: Secondary | ICD-10-CM | POA: Diagnosis not present

## 2023-04-01 DIAGNOSIS — J3089 Other allergic rhinitis: Secondary | ICD-10-CM | POA: Diagnosis not present

## 2023-04-09 DIAGNOSIS — J3081 Allergic rhinitis due to animal (cat) (dog) hair and dander: Secondary | ICD-10-CM | POA: Diagnosis not present

## 2023-04-09 DIAGNOSIS — J301 Allergic rhinitis due to pollen: Secondary | ICD-10-CM | POA: Diagnosis not present

## 2023-04-09 DIAGNOSIS — J3089 Other allergic rhinitis: Secondary | ICD-10-CM | POA: Diagnosis not present

## 2023-04-20 DIAGNOSIS — J301 Allergic rhinitis due to pollen: Secondary | ICD-10-CM | POA: Diagnosis not present

## 2023-04-20 DIAGNOSIS — J3089 Other allergic rhinitis: Secondary | ICD-10-CM | POA: Diagnosis not present

## 2023-04-20 DIAGNOSIS — J3081 Allergic rhinitis due to animal (cat) (dog) hair and dander: Secondary | ICD-10-CM | POA: Diagnosis not present

## 2023-05-07 DIAGNOSIS — M359 Systemic involvement of connective tissue, unspecified: Secondary | ICD-10-CM | POA: Diagnosis not present

## 2023-05-07 DIAGNOSIS — Z79899 Other long term (current) drug therapy: Secondary | ICD-10-CM | POA: Diagnosis not present

## 2023-05-07 DIAGNOSIS — M1991 Primary osteoarthritis, unspecified site: Secondary | ICD-10-CM | POA: Diagnosis not present

## 2023-05-07 DIAGNOSIS — L932 Other local lupus erythematosus: Secondary | ICD-10-CM | POA: Diagnosis not present

## 2023-05-12 DIAGNOSIS — J3081 Allergic rhinitis due to animal (cat) (dog) hair and dander: Secondary | ICD-10-CM | POA: Diagnosis not present

## 2023-05-12 DIAGNOSIS — J301 Allergic rhinitis due to pollen: Secondary | ICD-10-CM | POA: Diagnosis not present

## 2023-05-12 DIAGNOSIS — J3089 Other allergic rhinitis: Secondary | ICD-10-CM | POA: Diagnosis not present

## 2023-05-13 ENCOUNTER — Encounter: Payer: Self-pay | Admitting: Nurse Practitioner

## 2023-05-13 ENCOUNTER — Ambulatory Visit: Payer: BC Managed Care – PPO | Attending: Nurse Practitioner | Admitting: Nurse Practitioner

## 2023-05-13 VITALS — BP 116/82 | HR 77 | Ht 64.0 in | Wt 156.0 lb

## 2023-05-13 DIAGNOSIS — I351 Nonrheumatic aortic (valve) insufficiency: Secondary | ICD-10-CM

## 2023-05-13 DIAGNOSIS — R002 Palpitations: Secondary | ICD-10-CM

## 2023-05-13 DIAGNOSIS — I471 Supraventricular tachycardia, unspecified: Secondary | ICD-10-CM

## 2023-05-13 NOTE — Patient Instructions (Signed)
Medication Instructions:   Your physician recommends that you continue on your current medications as directed. Please refer to the Current Medication list given to you today.   *If you need a refill on your cardiac medications before your next appointment, please call your pharmacy*   Lab Work:  None ordered.  If you have labs (blood work) drawn today and your tests are completely normal, you will receive your results only by: MyChart Message (if you have MyChart) OR A paper copy in the mail If you have any lab test that is abnormal or we need to change your treatment, we will call you to review the results.   Testing/Procedures:  None ordered.   Follow-Up: At [facility], you and your health needs are our priority.  As part of our continuing mission to provide you with exceptional heart care, we have created designated Provider Care Teams.  These Care Teams include your primary Cardiologist (physician) and Advanced Practice Providers (APPs -  Physician Assistants and Nurse Practitioners) who all work together to provide you with the care you need, when you need it.  We recommend signing up for the patient portal called "MyChart".  Sign up information is provided on this After Visit Summary.  MyChart is used to connect with patients for Virtual Visits (Telemedicine).  Patients are able to view lab/test results, encounter notes, upcoming appointments, etc.  Non-urgent messages can be sent to your provider as well.   To learn more about what you can do with MyChart, go to [email or web address].    Your next appointment:   1 year(s)  Provider:   Parke Poisson, MD  or Lebron Conners    Other Instructions  Your physician wants you to follow-up in: 1 year.  You will receive a reminder letter in the mail two months in advance. If you don't receive a letter, please call our office to schedule the follow-up appointment.

## 2023-05-20 DIAGNOSIS — J3089 Other allergic rhinitis: Secondary | ICD-10-CM | POA: Diagnosis not present

## 2023-05-20 DIAGNOSIS — J301 Allergic rhinitis due to pollen: Secondary | ICD-10-CM | POA: Diagnosis not present

## 2023-05-20 DIAGNOSIS — J3081 Allergic rhinitis due to animal (cat) (dog) hair and dander: Secondary | ICD-10-CM | POA: Diagnosis not present

## 2023-05-25 DIAGNOSIS — J3089 Other allergic rhinitis: Secondary | ICD-10-CM | POA: Diagnosis not present

## 2023-05-25 DIAGNOSIS — J301 Allergic rhinitis due to pollen: Secondary | ICD-10-CM | POA: Diagnosis not present

## 2023-05-25 DIAGNOSIS — J3081 Allergic rhinitis due to animal (cat) (dog) hair and dander: Secondary | ICD-10-CM | POA: Diagnosis not present

## 2023-05-31 DIAGNOSIS — Z6827 Body mass index (BMI) 27.0-27.9, adult: Secondary | ICD-10-CM | POA: Diagnosis not present

## 2023-05-31 DIAGNOSIS — Z1231 Encounter for screening mammogram for malignant neoplasm of breast: Secondary | ICD-10-CM | POA: Diagnosis not present

## 2023-05-31 DIAGNOSIS — Z01419 Encounter for gynecological examination (general) (routine) without abnormal findings: Secondary | ICD-10-CM | POA: Diagnosis not present

## 2023-06-18 DIAGNOSIS — J301 Allergic rhinitis due to pollen: Secondary | ICD-10-CM | POA: Diagnosis not present

## 2023-06-18 DIAGNOSIS — Z23 Encounter for immunization: Secondary | ICD-10-CM | POA: Diagnosis not present

## 2023-06-18 DIAGNOSIS — J3081 Allergic rhinitis due to animal (cat) (dog) hair and dander: Secondary | ICD-10-CM | POA: Diagnosis not present

## 2023-06-18 DIAGNOSIS — J3089 Other allergic rhinitis: Secondary | ICD-10-CM | POA: Diagnosis not present

## 2023-06-22 DIAGNOSIS — Z4789 Encounter for other orthopedic aftercare: Secondary | ICD-10-CM | POA: Diagnosis not present

## 2023-06-22 DIAGNOSIS — T8484XA Pain due to internal orthopedic prosthetic devices, implants and grafts, initial encounter: Secondary | ICD-10-CM | POA: Diagnosis not present

## 2023-06-22 DIAGNOSIS — M19071 Primary osteoarthritis, right ankle and foot: Secondary | ICD-10-CM | POA: Diagnosis not present

## 2023-06-28 DIAGNOSIS — J301 Allergic rhinitis due to pollen: Secondary | ICD-10-CM | POA: Diagnosis not present

## 2023-06-29 DIAGNOSIS — J3089 Other allergic rhinitis: Secondary | ICD-10-CM | POA: Diagnosis not present

## 2023-07-02 DIAGNOSIS — J3081 Allergic rhinitis due to animal (cat) (dog) hair and dander: Secondary | ICD-10-CM | POA: Diagnosis not present

## 2023-07-02 DIAGNOSIS — J3089 Other allergic rhinitis: Secondary | ICD-10-CM | POA: Diagnosis not present

## 2023-07-02 DIAGNOSIS — J301 Allergic rhinitis due to pollen: Secondary | ICD-10-CM | POA: Diagnosis not present

## 2023-07-08 DIAGNOSIS — J3089 Other allergic rhinitis: Secondary | ICD-10-CM | POA: Diagnosis not present

## 2023-07-08 DIAGNOSIS — J301 Allergic rhinitis due to pollen: Secondary | ICD-10-CM | POA: Diagnosis not present

## 2023-07-08 DIAGNOSIS — J3081 Allergic rhinitis due to animal (cat) (dog) hair and dander: Secondary | ICD-10-CM | POA: Diagnosis not present

## 2023-07-23 DIAGNOSIS — J3089 Other allergic rhinitis: Secondary | ICD-10-CM | POA: Diagnosis not present

## 2023-07-23 DIAGNOSIS — J301 Allergic rhinitis due to pollen: Secondary | ICD-10-CM | POA: Diagnosis not present

## 2023-07-23 DIAGNOSIS — J3081 Allergic rhinitis due to animal (cat) (dog) hair and dander: Secondary | ICD-10-CM | POA: Diagnosis not present

## 2023-07-30 DIAGNOSIS — J301 Allergic rhinitis due to pollen: Secondary | ICD-10-CM | POA: Diagnosis not present

## 2023-07-30 DIAGNOSIS — J3089 Other allergic rhinitis: Secondary | ICD-10-CM | POA: Diagnosis not present

## 2023-08-24 DIAGNOSIS — R051 Acute cough: Secondary | ICD-10-CM | POA: Diagnosis not present

## 2023-08-24 DIAGNOSIS — R52 Pain, unspecified: Secondary | ICD-10-CM | POA: Diagnosis not present

## 2023-08-24 DIAGNOSIS — Z03818 Encounter for observation for suspected exposure to other biological agents ruled out: Secondary | ICD-10-CM | POA: Diagnosis not present

## 2023-08-24 DIAGNOSIS — R519 Headache, unspecified: Secondary | ICD-10-CM | POA: Diagnosis not present

## 2023-08-24 DIAGNOSIS — Z6826 Body mass index (BMI) 26.0-26.9, adult: Secondary | ICD-10-CM | POA: Diagnosis not present

## 2023-09-23 DIAGNOSIS — J301 Allergic rhinitis due to pollen: Secondary | ICD-10-CM | POA: Diagnosis not present

## 2023-09-23 DIAGNOSIS — J3089 Other allergic rhinitis: Secondary | ICD-10-CM | POA: Diagnosis not present

## 2023-09-24 DIAGNOSIS — J3081 Allergic rhinitis due to animal (cat) (dog) hair and dander: Secondary | ICD-10-CM | POA: Diagnosis not present

## 2023-09-24 DIAGNOSIS — J301 Allergic rhinitis due to pollen: Secondary | ICD-10-CM | POA: Diagnosis not present

## 2023-09-24 DIAGNOSIS — J3089 Other allergic rhinitis: Secondary | ICD-10-CM | POA: Diagnosis not present

## 2023-10-04 DIAGNOSIS — J3089 Other allergic rhinitis: Secondary | ICD-10-CM | POA: Diagnosis not present

## 2023-10-04 DIAGNOSIS — J3081 Allergic rhinitis due to animal (cat) (dog) hair and dander: Secondary | ICD-10-CM | POA: Diagnosis not present

## 2023-10-04 DIAGNOSIS — J301 Allergic rhinitis due to pollen: Secondary | ICD-10-CM | POA: Diagnosis not present

## 2023-10-07 DIAGNOSIS — J301 Allergic rhinitis due to pollen: Secondary | ICD-10-CM | POA: Diagnosis not present

## 2023-10-07 DIAGNOSIS — J3089 Other allergic rhinitis: Secondary | ICD-10-CM | POA: Diagnosis not present

## 2023-10-11 DIAGNOSIS — J3081 Allergic rhinitis due to animal (cat) (dog) hair and dander: Secondary | ICD-10-CM | POA: Diagnosis not present

## 2023-10-11 DIAGNOSIS — J3089 Other allergic rhinitis: Secondary | ICD-10-CM | POA: Diagnosis not present

## 2023-10-11 DIAGNOSIS — J301 Allergic rhinitis due to pollen: Secondary | ICD-10-CM | POA: Diagnosis not present

## 2023-10-15 DIAGNOSIS — J3089 Other allergic rhinitis: Secondary | ICD-10-CM | POA: Diagnosis not present

## 2023-10-15 DIAGNOSIS — J3081 Allergic rhinitis due to animal (cat) (dog) hair and dander: Secondary | ICD-10-CM | POA: Diagnosis not present

## 2023-10-15 DIAGNOSIS — J301 Allergic rhinitis due to pollen: Secondary | ICD-10-CM | POA: Diagnosis not present

## 2023-10-21 DIAGNOSIS — J3089 Other allergic rhinitis: Secondary | ICD-10-CM | POA: Diagnosis not present

## 2023-10-21 DIAGNOSIS — J3081 Allergic rhinitis due to animal (cat) (dog) hair and dander: Secondary | ICD-10-CM | POA: Diagnosis not present

## 2023-10-21 DIAGNOSIS — J301 Allergic rhinitis due to pollen: Secondary | ICD-10-CM | POA: Diagnosis not present

## 2023-10-25 DIAGNOSIS — J3081 Allergic rhinitis due to animal (cat) (dog) hair and dander: Secondary | ICD-10-CM | POA: Diagnosis not present

## 2023-10-25 DIAGNOSIS — J301 Allergic rhinitis due to pollen: Secondary | ICD-10-CM | POA: Diagnosis not present

## 2023-10-25 DIAGNOSIS — J3089 Other allergic rhinitis: Secondary | ICD-10-CM | POA: Diagnosis not present

## 2023-11-01 DIAGNOSIS — J3081 Allergic rhinitis due to animal (cat) (dog) hair and dander: Secondary | ICD-10-CM | POA: Diagnosis not present

## 2023-11-01 DIAGNOSIS — J3089 Other allergic rhinitis: Secondary | ICD-10-CM | POA: Diagnosis not present

## 2023-11-01 DIAGNOSIS — J301 Allergic rhinitis due to pollen: Secondary | ICD-10-CM | POA: Diagnosis not present

## 2023-11-04 DIAGNOSIS — Z79899 Other long term (current) drug therapy: Secondary | ICD-10-CM | POA: Diagnosis not present

## 2023-11-04 DIAGNOSIS — M1991 Primary osteoarthritis, unspecified site: Secondary | ICD-10-CM | POA: Diagnosis not present

## 2023-11-04 DIAGNOSIS — M359 Systemic involvement of connective tissue, unspecified: Secondary | ICD-10-CM | POA: Diagnosis not present

## 2023-11-04 DIAGNOSIS — E559 Vitamin D deficiency, unspecified: Secondary | ICD-10-CM | POA: Diagnosis not present

## 2023-11-04 DIAGNOSIS — L932 Other local lupus erythematosus: Secondary | ICD-10-CM | POA: Diagnosis not present

## 2023-11-04 DIAGNOSIS — Z8739 Personal history of other diseases of the musculoskeletal system and connective tissue: Secondary | ICD-10-CM | POA: Diagnosis not present

## 2023-11-08 DIAGNOSIS — J3089 Other allergic rhinitis: Secondary | ICD-10-CM | POA: Diagnosis not present

## 2023-11-08 DIAGNOSIS — J3081 Allergic rhinitis due to animal (cat) (dog) hair and dander: Secondary | ICD-10-CM | POA: Diagnosis not present

## 2023-11-08 DIAGNOSIS — J301 Allergic rhinitis due to pollen: Secondary | ICD-10-CM | POA: Diagnosis not present

## 2023-11-15 DIAGNOSIS — J301 Allergic rhinitis due to pollen: Secondary | ICD-10-CM | POA: Diagnosis not present

## 2023-11-15 DIAGNOSIS — J3089 Other allergic rhinitis: Secondary | ICD-10-CM | POA: Diagnosis not present

## 2023-11-15 DIAGNOSIS — J3081 Allergic rhinitis due to animal (cat) (dog) hair and dander: Secondary | ICD-10-CM | POA: Diagnosis not present

## 2023-11-22 DIAGNOSIS — Z0000 Encounter for general adult medical examination without abnormal findings: Secondary | ICD-10-CM | POA: Diagnosis not present

## 2023-11-22 DIAGNOSIS — Z13220 Encounter for screening for lipoid disorders: Secondary | ICD-10-CM | POA: Diagnosis not present

## 2023-11-22 DIAGNOSIS — E039 Hypothyroidism, unspecified: Secondary | ICD-10-CM | POA: Diagnosis not present

## 2023-11-22 DIAGNOSIS — E538 Deficiency of other specified B group vitamins: Secondary | ICD-10-CM | POA: Diagnosis not present

## 2023-11-29 DIAGNOSIS — E538 Deficiency of other specified B group vitamins: Secondary | ICD-10-CM | POA: Diagnosis not present

## 2023-11-29 DIAGNOSIS — F339 Major depressive disorder, recurrent, unspecified: Secondary | ICD-10-CM | POA: Diagnosis not present

## 2023-11-29 DIAGNOSIS — Z0000 Encounter for general adult medical examination without abnormal findings: Secondary | ICD-10-CM | POA: Diagnosis not present

## 2023-11-29 DIAGNOSIS — E039 Hypothyroidism, unspecified: Secondary | ICD-10-CM | POA: Diagnosis not present

## 2023-11-29 DIAGNOSIS — D510 Vitamin B12 deficiency anemia due to intrinsic factor deficiency: Secondary | ICD-10-CM | POA: Diagnosis not present

## 2023-11-30 ENCOUNTER — Other Ambulatory Visit: Payer: Self-pay

## 2023-11-30 ENCOUNTER — Encounter (HOSPITAL_COMMUNITY): Payer: Self-pay

## 2023-11-30 ENCOUNTER — Emergency Department (HOSPITAL_COMMUNITY)

## 2023-11-30 ENCOUNTER — Inpatient Hospital Stay (HOSPITAL_COMMUNITY)
Admission: EM | Admit: 2023-11-30 | Discharge: 2023-12-04 | DRG: 964 | Disposition: A | Discharge status: Rehabilitation Facility | Attending: Surgery | Admitting: Surgery

## 2023-11-30 DIAGNOSIS — D696 Thrombocytopenia, unspecified: Secondary | ICD-10-CM | POA: Diagnosis present

## 2023-11-30 DIAGNOSIS — Z90710 Acquired absence of both cervix and uterus: Secondary | ICD-10-CM

## 2023-11-30 DIAGNOSIS — S2249XA Multiple fractures of ribs, unspecified side, initial encounter for closed fracture: Secondary | ICD-10-CM | POA: Diagnosis not present

## 2023-11-30 DIAGNOSIS — R600 Localized edema: Secondary | ICD-10-CM | POA: Diagnosis not present

## 2023-11-30 DIAGNOSIS — M4802 Spinal stenosis, cervical region: Secondary | ICD-10-CM | POA: Diagnosis not present

## 2023-11-30 DIAGNOSIS — R3911 Hesitancy of micturition: Secondary | ICD-10-CM | POA: Diagnosis present

## 2023-11-30 DIAGNOSIS — E663 Overweight: Secondary | ICD-10-CM | POA: Diagnosis not present

## 2023-11-30 DIAGNOSIS — M50322 Other cervical disc degeneration at C5-C6 level: Secondary | ICD-10-CM | POA: Diagnosis present

## 2023-11-30 DIAGNOSIS — Z791 Long term (current) use of non-steroidal anti-inflammatories (NSAID): Secondary | ICD-10-CM

## 2023-11-30 DIAGNOSIS — S32591A Other specified fracture of right pubis, initial encounter for closed fracture: Secondary | ICD-10-CM | POA: Diagnosis present

## 2023-11-30 DIAGNOSIS — S0990XA Unspecified injury of head, initial encounter: Secondary | ICD-10-CM | POA: Diagnosis not present

## 2023-11-30 DIAGNOSIS — Z79890 Hormone replacement therapy (postmenopausal): Secondary | ICD-10-CM | POA: Diagnosis not present

## 2023-11-30 DIAGNOSIS — Z885 Allergy status to narcotic agent status: Secondary | ICD-10-CM | POA: Diagnosis not present

## 2023-11-30 DIAGNOSIS — S82141A Displaced bicondylar fracture of right tibia, initial encounter for closed fracture: Secondary | ICD-10-CM | POA: Diagnosis not present

## 2023-11-30 DIAGNOSIS — K5900 Constipation, unspecified: Secondary | ICD-10-CM | POA: Diagnosis not present

## 2023-11-30 DIAGNOSIS — Z86711 Personal history of pulmonary embolism: Secondary | ICD-10-CM

## 2023-11-30 DIAGNOSIS — Z8249 Family history of ischemic heart disease and other diseases of the circulatory system: Secondary | ICD-10-CM

## 2023-11-30 DIAGNOSIS — I959 Hypotension, unspecified: Secondary | ICD-10-CM | POA: Diagnosis present

## 2023-11-30 DIAGNOSIS — Y9355 Activity, bike riding: Secondary | ICD-10-CM | POA: Diagnosis not present

## 2023-11-30 DIAGNOSIS — R0789 Other chest pain: Secondary | ICD-10-CM | POA: Diagnosis present

## 2023-11-30 DIAGNOSIS — D62 Acute posthemorrhagic anemia: Secondary | ICD-10-CM | POA: Diagnosis not present

## 2023-11-30 DIAGNOSIS — S2241XD Multiple fractures of ribs, right side, subsequent encounter for fracture with routine healing: Secondary | ICD-10-CM | POA: Diagnosis not present

## 2023-11-30 DIAGNOSIS — N83202 Unspecified ovarian cyst, left side: Secondary | ICD-10-CM | POA: Diagnosis not present

## 2023-11-30 DIAGNOSIS — S2241XA Multiple fractures of ribs, right side, initial encounter for closed fracture: Principal | ICD-10-CM | POA: Diagnosis present

## 2023-11-30 DIAGNOSIS — S36899D Unspecified injury of other intra-abdominal organs, subsequent encounter: Secondary | ICD-10-CM | POA: Diagnosis not present

## 2023-11-30 DIAGNOSIS — M47812 Spondylosis without myelopathy or radiculopathy, cervical region: Secondary | ICD-10-CM | POA: Diagnosis not present

## 2023-11-30 DIAGNOSIS — S32511A Fracture of superior rim of right pubis, initial encounter for closed fracture: Secondary | ICD-10-CM | POA: Diagnosis not present

## 2023-11-30 DIAGNOSIS — Z882 Allergy status to sulfonamides status: Secondary | ICD-10-CM | POA: Diagnosis not present

## 2023-11-30 DIAGNOSIS — M4312 Spondylolisthesis, cervical region: Secondary | ICD-10-CM | POA: Diagnosis present

## 2023-11-30 DIAGNOSIS — Z881 Allergy status to other antibiotic agents status: Secondary | ICD-10-CM | POA: Diagnosis not present

## 2023-11-30 DIAGNOSIS — E669 Obesity, unspecified: Secondary | ICD-10-CM | POA: Diagnosis not present

## 2023-11-30 DIAGNOSIS — G4700 Insomnia, unspecified: Secondary | ICD-10-CM | POA: Diagnosis not present

## 2023-11-30 DIAGNOSIS — E039 Hypothyroidism, unspecified: Secondary | ICD-10-CM | POA: Diagnosis not present

## 2023-11-30 DIAGNOSIS — N83201 Unspecified ovarian cyst, right side: Secondary | ICD-10-CM | POA: Diagnosis not present

## 2023-11-30 DIAGNOSIS — I9589 Other hypotension: Secondary | ICD-10-CM | POA: Diagnosis not present

## 2023-11-30 DIAGNOSIS — Z79899 Other long term (current) drug therapy: Secondary | ICD-10-CM | POA: Diagnosis not present

## 2023-11-30 DIAGNOSIS — R911 Solitary pulmonary nodule: Secondary | ICD-10-CM | POA: Diagnosis present

## 2023-11-30 DIAGNOSIS — S82141D Displaced bicondylar fracture of right tibia, subsequent encounter for closed fracture with routine healing: Secondary | ICD-10-CM | POA: Diagnosis not present

## 2023-11-30 DIAGNOSIS — S32511D Fracture of superior rim of right pubis, subsequent encounter for fracture with routine healing: Secondary | ICD-10-CM | POA: Diagnosis not present

## 2023-11-30 DIAGNOSIS — Z880 Allergy status to penicillin: Secondary | ICD-10-CM

## 2023-11-30 DIAGNOSIS — S36899A Unspecified injury of other intra-abdominal organs, initial encounter: Secondary | ICD-10-CM | POA: Diagnosis not present

## 2023-11-30 DIAGNOSIS — M62838 Other muscle spasm: Secondary | ICD-10-CM | POA: Diagnosis not present

## 2023-11-30 DIAGNOSIS — Z041 Encounter for examination and observation following transport accident: Secondary | ICD-10-CM | POA: Diagnosis not present

## 2023-11-30 DIAGNOSIS — W19XXXA Unspecified fall, initial encounter: Secondary | ICD-10-CM | POA: Diagnosis not present

## 2023-11-30 DIAGNOSIS — M25461 Effusion, right knee: Secondary | ICD-10-CM | POA: Diagnosis not present

## 2023-11-30 DIAGNOSIS — S199XXA Unspecified injury of neck, initial encounter: Secondary | ICD-10-CM | POA: Diagnosis not present

## 2023-11-30 DIAGNOSIS — T1490XA Injury, unspecified, initial encounter: Secondary | ICD-10-CM | POA: Diagnosis present

## 2023-11-30 DIAGNOSIS — M359 Systemic involvement of connective tissue, unspecified: Secondary | ICD-10-CM | POA: Diagnosis not present

## 2023-11-30 DIAGNOSIS — M5030 Other cervical disc degeneration, unspecified cervical region: Secondary | ICD-10-CM | POA: Diagnosis not present

## 2023-11-30 DIAGNOSIS — D709 Neutropenia, unspecified: Secondary | ICD-10-CM | POA: Diagnosis not present

## 2023-11-30 DIAGNOSIS — M3589 Other specified systemic involvement of connective tissue: Secondary | ICD-10-CM | POA: Diagnosis not present

## 2023-11-30 DIAGNOSIS — S82291A Other fracture of shaft of right tibia, initial encounter for closed fracture: Secondary | ICD-10-CM | POA: Diagnosis not present

## 2023-11-30 LAB — CBC WITH DIFFERENTIAL/PLATELET
Abs Immature Granulocytes: 0.08 10*3/uL — ABNORMAL HIGH (ref 0.00–0.07)
Basophils Absolute: 0 10*3/uL (ref 0.0–0.1)
Basophils Relative: 0 %
Eosinophils Absolute: 0.1 10*3/uL (ref 0.0–0.5)
Eosinophils Relative: 1 %
HCT: 38.1 % (ref 36.0–46.0)
Hemoglobin: 12.6 g/dL (ref 12.0–15.0)
Immature Granulocytes: 1 %
Lymphocytes Relative: 12 %
Lymphs Abs: 1.1 10*3/uL (ref 0.7–4.0)
MCH: 31.7 pg (ref 26.0–34.0)
MCHC: 33.1 g/dL (ref 30.0–36.0)
MCV: 96 fL (ref 80.0–100.0)
Monocytes Absolute: 0.4 10*3/uL (ref 0.1–1.0)
Monocytes Relative: 4 %
Neutro Abs: 7.4 10*3/uL (ref 1.7–7.7)
Neutrophils Relative %: 82 %
Platelets: 187 10*3/uL (ref 150–400)
RBC: 3.97 MIL/uL (ref 3.87–5.11)
RDW: 12 % (ref 11.5–15.5)
WBC: 9.1 10*3/uL (ref 4.0–10.5)
nRBC: 0 % (ref 0.0–0.2)

## 2023-11-30 LAB — BASIC METABOLIC PANEL WITH GFR
Anion gap: 6 (ref 5–15)
BUN: 17 mg/dL (ref 6–20)
CO2: 26 mmol/L (ref 22–32)
Calcium: 9 mg/dL (ref 8.9–10.3)
Chloride: 109 mmol/L (ref 98–111)
Creatinine, Ser: 0.84 mg/dL (ref 0.44–1.00)
GFR, Estimated: >60 mL/min (ref >60)
Glucose, Bld: 100 mg/dL — ABNORMAL HIGH (ref 70–99)
Potassium: 3.8 mmol/L (ref 3.5–5.1)
Sodium: 141 mmol/L (ref 135–145)

## 2023-11-30 MED ORDER — HYDROMORPHONE HCL 1 MG/ML IJ SOLN: 1.0000 mg | Freq: Once | INTRAMUSCULAR | Status: DC

## 2023-11-30 MED ORDER — METOPROLOL TARTRATE 5 MG/5ML IV SOLN: 5.0000 mg | Freq: Four times a day (QID) | INTRAVENOUS | Status: DC | PRN

## 2023-11-30 MED ORDER — IOHEXOL 350 MG/ML SOLN
75.0000 mL | Freq: Once | INTRAVENOUS | Status: AC | PRN
  Administered 2023-11-30: 75 mL via INTRAVENOUS

## 2023-11-30 MED ORDER — LEVOTHYROXINE SODIUM 25 MCG PO TABS
125.0000 ug | ORAL_TABLET | Freq: Every day | ORAL | Status: DC
  Administered 2023-12-01 – 2023-12-04 (×4): 125 ug via ORAL
  Filled 2023-11-30 (×4): qty 1

## 2023-11-30 MED ORDER — HYDRALAZINE HCL 20 MG/ML IJ SOLN: 10.0000 mg | INTRAMUSCULAR | Status: DC | PRN

## 2023-11-30 MED ORDER — ONDANSETRON 4 MG PO TBDP: 4.0000 mg | ORAL_TABLET | Freq: Four times a day (QID) | ORAL | Status: DC | PRN

## 2023-11-30 MED ORDER — METHOCARBAMOL 500 MG PO TABS
500.0000 mg | ORAL_TABLET | Freq: Three times a day (TID) | ORAL | Status: DC
  Administered 2023-12-01 – 2023-12-03 (×7): 500 mg via ORAL
  Filled 2023-11-30 (×8): qty 1

## 2023-11-30 MED ORDER — ACETAMINOPHEN 500 MG PO TABS
1000.0000 mg | ORAL_TABLET | Freq: Four times a day (QID) | ORAL | Status: DC
  Administered 2023-12-01 – 2023-12-02 (×5): 1000 mg via ORAL
  Filled 2023-11-30 (×7): qty 2

## 2023-11-30 MED ORDER — DOCUSATE SODIUM 100 MG PO CAPS
100.0000 mg | ORAL_CAPSULE | Freq: Two times a day (BID) | ORAL | Status: DC
  Administered 2023-12-01 – 2023-12-04 (×8): 100 mg via ORAL
  Filled 2023-11-30 (×8): qty 1

## 2023-11-30 MED ORDER — KETAMINE HCL 50 MG/5ML IJ SOSY
0.3000 mg/kg | PREFILLED_SYRINGE | Freq: Once | INTRAMUSCULAR | Status: AC
  Administered 2023-11-30: 22 mg via INTRAVENOUS
  Filled 2023-11-30: qty 5

## 2023-11-30 MED ORDER — TRAZODONE HCL 50 MG PO TABS
100.0000 mg | ORAL_TABLET | Freq: Every day | ORAL | Status: DC
  Administered 2023-12-01 – 2023-12-03 (×4): 100 mg via ORAL
  Filled 2023-11-30 (×4): qty 2

## 2023-11-30 MED ORDER — OXYCODONE HCL 5 MG PO TABS
5.0000 mg | ORAL_TABLET | ORAL | Status: DC | PRN
  Administered 2023-12-02 – 2023-12-03 (×3): 5 mg via ORAL
  Filled 2023-11-30 (×3): qty 1

## 2023-11-30 MED ORDER — MELATONIN 3 MG PO TABS: 3.0000 mg | ORAL_TABLET | Freq: Every evening | ORAL | Status: DC | PRN

## 2023-11-30 MED ORDER — ENOXAPARIN SODIUM 30 MG/0.3ML IJ SOSY
30.0000 mg | PREFILLED_SYRINGE | Freq: Two times a day (BID) | INTRAMUSCULAR | Status: DC
  Administered 2023-12-02 – 2023-12-04 (×5): 30 mg via SUBCUTANEOUS
  Filled 2023-11-30 (×5): qty 0.3

## 2023-11-30 MED ORDER — ONDANSETRON HCL 4 MG/2ML IJ SOLN
4.0000 mg | Freq: Once | INTRAMUSCULAR | Status: AC
  Administered 2023-11-30: 4 mg via INTRAVENOUS
  Filled 2023-11-30: qty 2

## 2023-11-30 MED ORDER — POLYETHYLENE GLYCOL 3350 17 G PO PACK: 17.0000 g | PACK | Freq: Every day | ORAL | Status: DC | PRN

## 2023-11-30 MED ORDER — HYDROMORPHONE HCL 1 MG/ML IJ SOLN: 0.5000 mg | INTRAMUSCULAR | Status: DC | PRN

## 2023-11-30 MED ORDER — CITALOPRAM HYDROBROMIDE 20 MG PO TABS
20.0000 mg | ORAL_TABLET | Freq: Every day | ORAL | Status: DC
  Administered 2023-12-01 – 2023-12-04 (×4): 20 mg via ORAL
  Filled 2023-11-30: qty 2
  Filled 2023-11-30 (×3): qty 1

## 2023-11-30 MED ORDER — HYDROMORPHONE HCL 1 MG/ML IJ SOLN
1.0000 mg | Freq: Once | INTRAMUSCULAR | Status: AC
  Administered 2023-11-30: 1 mg via INTRAVENOUS
  Filled 2023-11-30: qty 1

## 2023-11-30 MED ORDER — FENTANYL CITRATE PF 50 MCG/ML IJ SOSY
100.0000 ug | PREFILLED_SYRINGE | Freq: Once | INTRAMUSCULAR | Status: AC
  Administered 2023-11-30: 100 ug via INTRAVENOUS
  Filled 2023-11-30: qty 2

## 2023-11-30 MED ORDER — OXYCODONE HCL 5 MG PO TABS
10.0000 mg | ORAL_TABLET | ORAL | Status: DC | PRN
  Administered 2023-12-01 – 2023-12-02 (×2): 10 mg via ORAL
  Filled 2023-11-30 (×5): qty 2

## 2023-11-30 MED ORDER — HYDROMORPHONE HCL 1 MG/ML IJ SOLN
2.0000 mg | Freq: Once | INTRAMUSCULAR | Status: DC
  Filled 2023-11-30: qty 2

## 2023-11-30 MED ORDER — METHOCARBAMOL 1000 MG/10ML IJ SOLN: 500.0000 mg | Freq: Three times a day (TID) | INTRAMUSCULAR | Status: DC

## 2023-11-30 MED ORDER — HYDROMORPHONE HCL 1 MG/ML IJ SOLN
0.5000 mg | INTRAMUSCULAR | Status: DC | PRN
  Administered 2023-12-01: 0.5 mg via INTRAVENOUS
  Administered 2023-12-01 – 2023-12-02 (×2): 1 mg via INTRAVENOUS
  Filled 2023-11-30 (×3): qty 1

## 2023-11-30 MED ORDER — SODIUM CHLORIDE 0.9 % IV SOLN: INTRAVENOUS | Status: AC

## 2023-11-30 MED ORDER — GABAPENTIN 300 MG PO CAPS
300.0000 mg | ORAL_CAPSULE | Freq: Three times a day (TID) | ORAL | Status: DC
  Administered 2023-12-01: 300 mg via ORAL
  Filled 2023-11-30: qty 1

## 2023-11-30 MED ORDER — ONDANSETRON HCL 4 MG/2ML IJ SOLN
4.0000 mg | Freq: Four times a day (QID) | INTRAMUSCULAR | Status: DC | PRN
  Administered 2023-12-01 – 2023-12-03 (×4): 4 mg via INTRAVENOUS
  Filled 2023-11-30 (×4): qty 2

## 2023-11-30 MED ORDER — SODIUM CHLORIDE 0.9 % IV BOLUS
1000.0000 mL | Freq: Once | INTRAVENOUS | Status: AC
  Administered 2023-11-30: 1000 mL via INTRAVENOUS
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
## 2023-11-30 NOTE — ED Notes (Signed)
Pt off unit to imaging.

## 2023-11-30 NOTE — ED Notes (Signed)
 Pt reports decreased pain post dilaudid administration.

## 2023-12-01 ENCOUNTER — Encounter (HOSPITAL_COMMUNITY): Payer: Self-pay

## 2023-12-01 ENCOUNTER — Inpatient Hospital Stay (HOSPITAL_COMMUNITY)

## 2023-12-01 LAB — URINALYSIS, COMPLETE (UACMP) WITH MICROSCOPIC
Bacteria, UA: NONE SEEN
Bilirubin Urine: NEGATIVE
Glucose, UA: NEGATIVE mg/dL
Hgb urine dipstick: NEGATIVE
Ketones, ur: NEGATIVE mg/dL
Leukocytes,Ua: NEGATIVE
Nitrite: NEGATIVE
Protein, ur: NEGATIVE mg/dL
Specific Gravity, Urine: 1.026 (ref 1.005–1.030)
pH: 5 (ref 5.0–8.0)

## 2023-12-01 LAB — CBC
HCT: 31.7 % — ABNORMAL LOW (ref 36.0–46.0)
HCT: 30.3 % — ABNORMAL LOW (ref 36.0–46.0)
Hemoglobin: 10.1 g/dL — ABNORMAL LOW (ref 12.0–15.0)
Hemoglobin: 9.8 g/dL — ABNORMAL LOW (ref 12.0–15.0)
MCH: 31.4 pg (ref 26.0–34.0)
MCH: 31.4 pg (ref 26.0–34.0)
MCHC: 31.9 g/dL (ref 30.0–36.0)
MCHC: 32.3 g/dL (ref 30.0–36.0)
MCV: 98.4 fL (ref 80.0–100.0)
MCV: 97.1 fL (ref 80.0–100.0)
Platelets: 141 10*3/uL — ABNORMAL LOW (ref 150–400)
Platelets: 124 10*3/uL — ABNORMAL LOW (ref 150–400)
RBC: 3.22 MIL/uL — ABNORMAL LOW (ref 3.87–5.11)
RBC: 3.12 MIL/uL — ABNORMAL LOW (ref 3.87–5.11)
RDW: 12.1 % (ref 11.5–15.5)
RDW: 12.4 % (ref 11.5–15.5)
WBC: 5.7 10*3/uL (ref 4.0–10.5)
WBC: 4.5 10*3/uL (ref 4.0–10.5)
nRBC: 0 % (ref 0.0–0.2)
nRBC: 0 % (ref 0.0–0.2)

## 2023-12-01 LAB — BASIC METABOLIC PANEL WITH GFR
Anion gap: 3 — ABNORMAL LOW (ref 5–15)
BUN: 12 mg/dL (ref 6–20)
CO2: 27 mmol/L (ref 22–32)
Calcium: 8 mg/dL — ABNORMAL LOW (ref 8.9–10.3)
Chloride: 111 mmol/L (ref 98–111)
Creatinine, Ser: 0.8 mg/dL (ref 0.44–1.00)
GFR, Estimated: >60 mL/min (ref >60)
Glucose, Bld: 106 mg/dL — ABNORMAL HIGH (ref 70–99)
Potassium: 3.8 mmol/L (ref 3.5–5.1)
Sodium: 141 mmol/L (ref 135–145)

## 2023-12-01 MED ORDER — TAMSULOSIN HCL 0.4 MG PO CAPS
0.4000 mg | ORAL_CAPSULE | Freq: Every day | ORAL | Status: DC
  Administered 2023-12-01 – 2023-12-02 (×2): 0.4 mg via ORAL
  Filled 2023-12-01 (×2): qty 1

## 2023-12-01 MED ORDER — LIDOCAINE 5 % EX PTCH
2.0000 | MEDICATED_PATCH | CUTANEOUS | Status: DC
  Administered 2023-12-01 – 2023-12-04 (×4): 2 via TRANSDERMAL
  Filled 2023-12-01 (×4): qty 2

## 2023-12-01 MED ORDER — SODIUM CHLORIDE 0.9 % IV BOLUS
1000.0000 mL | Freq: Once | INTRAVENOUS | Status: AC
  Administered 2023-12-01: 1000 mL via INTRAVENOUS

## 2023-12-01 MED ORDER — GABAPENTIN 100 MG PO CAPS
100.0000 mg | ORAL_CAPSULE | Freq: Three times a day (TID) | ORAL | Status: DC
  Administered 2023-12-01 – 2023-12-04 (×10): 100 mg via ORAL
  Filled 2023-12-01 (×10): qty 1

## 2023-12-01 NOTE — TOC CAGE-AID Note (Signed)
 Transition of Care [facility]) - CAGE-AID Screening  Patient Details  Name: Kathryn Taylor MRN: [id number] Date of Birth: 08-29-1960  Clinical Narrative:  Patient endorses alcohol use, approximately 4-5x per week. Usually 1 glass of wine or 1 seltzer, never in excess. Patient denies ever experiencing withdrawal symptoms. Patient denies any illicit drug use. Substance abuse resources offered to patient but she denies need at this time.  CAGE-AID Screening:   Have You Ever Felt You Ought to Cut Down on Your Drinking or Drug Use?: No Have People Annoyed You By Critizing Your Drinking Or Drug Use?: No Have You Felt Bad Or Guilty About Your Drinking Or Drug Use?: No Have You Ever Had a Drink or Used Drugs First Thing In The Morning to Steady Your Nerves or to Get Rid of a Hangover?: No CAGE-AID Score: 0  Substance Abuse Education Offered: Yes
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
## 2023-12-01 NOTE — Plan of Care (Signed)

## 2023-12-01 NOTE — Progress Notes (Signed)
 Orthopedic Tech Progress Note Patient Details:  Rudine Rieger [birth year] [id number]  Ortho Devices Type of Ortho Device: Knee Immobilizer Ortho Device/Splint Location: RLE Ortho Device/Splint Interventions: Ordered, Application, Adjustment   Post Interventions Patient Tolerated: Fair Instructions Provided: Care of device  [place] A Florinda Hush 12/01/2023, 12:26 AM
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
## 2023-12-01 NOTE — Progress Notes (Signed)
  Inpatient Rehab Admissions Coordinator :  Per therapy recommendations, patient was screened for CIR candidacy by Ottie Glazier RN MSN.  At this time patient appears to be a potential candidate for CIR. I will place a rehab consult per protocol for full assessment. Please call me with any questions.  Ottie Glazier RN MSN Admissions Coordinator [phone]

## 2023-12-02 LAB — BASIC METABOLIC PANEL WITH GFR
Anion gap: 3 — ABNORMAL LOW (ref 5–15)
BUN: 9 mg/dL (ref 6–20)
CO2: 24 mmol/L (ref 22–32)
Calcium: 7.8 mg/dL — ABNORMAL LOW (ref 8.9–10.3)
Chloride: 113 mmol/L — ABNORMAL HIGH (ref 98–111)
Creatinine, Ser: 0.79 mg/dL (ref 0.44–1.00)
GFR, Estimated: >60 mL/min (ref >60)
Glucose, Bld: 111 mg/dL — ABNORMAL HIGH (ref 70–99)
Potassium: 3.9 mmol/L (ref 3.5–5.1)
Sodium: 140 mmol/L (ref 135–145)

## 2023-12-02 LAB — CBC
HCT: 27.8 % — ABNORMAL LOW (ref 36.0–46.0)
Hemoglobin: 9 g/dL — ABNORMAL LOW (ref 12.0–15.0)
MCH: 31.4 pg (ref 26.0–34.0)
MCHC: 32.4 g/dL (ref 30.0–36.0)
MCV: 96.9 fL (ref 80.0–100.0)
Platelets: 117 10*3/uL — ABNORMAL LOW (ref 150–400)
RBC: 2.87 MIL/uL — ABNORMAL LOW (ref 3.87–5.11)
RDW: 12.2 % (ref 11.5–15.5)
WBC: 4.8 10*3/uL (ref 4.0–10.5)
nRBC: 0 % (ref 0.0–0.2)

## 2023-12-02 LAB — MISC LABCORP TEST (SEND OUT): Labcorp test code: 83935

## 2023-12-02 MED ORDER — HYDROMORPHONE HCL 1 MG/ML IJ SOLN
0.5000 mg | INTRAMUSCULAR | Status: DC | PRN
  Administered 2023-12-02 – 2023-12-03 (×3): 0.5 mg via INTRAVENOUS
  Filled 2023-12-02 (×3): qty 0.5

## 2023-12-02 MED ORDER — TAMSULOSIN HCL 0.4 MG PO CAPS
0.8000 mg | ORAL_CAPSULE | Freq: Every day | ORAL | Status: DC
  Administered 2023-12-03 – 2023-12-04 (×2): 0.8 mg via ORAL
  Filled 2023-12-02 (×2): qty 2

## 2023-12-02 MED ORDER — TAMSULOSIN HCL 0.4 MG PO CAPS
0.4000 mg | ORAL_CAPSULE | Freq: Once | ORAL | Status: AC
  Administered 2023-12-02: 0.4 mg via ORAL
  Filled 2023-12-02: qty 1

## 2023-12-02 MED ORDER — ACETAMINOPHEN 325 MG PO TABS
650.0000 mg | ORAL_TABLET | Freq: Four times a day (QID) | ORAL | Status: DC
Start: 2023-12-02 — End: 2023-12-03
  Administered 2023-12-02 – 2023-12-03 (×5): 650 mg via ORAL
  Filled 2023-12-02 (×5): qty 2

## 2023-12-02 MED ORDER — TAMSULOSIN HCL 0.4 MG PO CAPS: 0.8000 mg | ORAL_CAPSULE | Freq: Every day | ORAL | Status: DC

## 2023-12-02 MED ORDER — BUTALBITAL-APAP-CAFFEINE 50-325-40 MG PO TABS: 1.0000 | ORAL_TABLET | Freq: Four times a day (QID) | ORAL | Status: DC | PRN

## 2023-12-02 NOTE — Progress Notes (Signed)
 IP rehab admissions - I met with patient and her spouse at the bedside.  I gave them rehab booklets and explained rehab process.  Once patient is getting out of bed to chair and able to tolerate an hour up in chair at a time, then I can send information to [facility] for review.  Patient and spouse are interested in CIR.  Spouse works on Wednesdays but can take time off to assist wife after discharge from rehab.  I will follw progress with therapies for now.  [phone]
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
## 2023-12-03 LAB — CBC
HCT: 30.9 % — ABNORMAL LOW (ref 36.0–46.0)
Hemoglobin: 10.2 g/dL — ABNORMAL LOW (ref 12.0–15.0)
MCH: 31.6 pg (ref 26.0–34.0)
MCHC: 33 g/dL (ref 30.0–36.0)
MCV: 95.7 fL (ref 80.0–100.0)
Platelets: 138 10*3/uL — ABNORMAL LOW (ref 150–400)
RBC: 3.23 MIL/uL — ABNORMAL LOW (ref 3.87–5.11)
RDW: 12.2 % (ref 11.5–15.5)
WBC: 4.6 10*3/uL (ref 4.0–10.5)
nRBC: 0 % (ref 0.0–0.2)

## 2023-12-03 MED ORDER — METHOCARBAMOL 750 MG PO TABS
750.0000 mg | ORAL_TABLET | Freq: Four times a day (QID) | ORAL | Status: DC
  Administered 2023-12-03 – 2023-12-04 (×4): 750 mg via ORAL
  Filled 2023-12-03 (×4): qty 1

## 2023-12-03 MED ORDER — METHOCARBAMOL 1000 MG/10ML IJ SOLN
500.0000 mg | Freq: Four times a day (QID) | INTRAMUSCULAR | Status: DC
  Filled 2023-12-03: qty 10

## 2023-12-03 MED ORDER — IBUPROFEN 200 MG PO TABS
600.0000 mg | ORAL_TABLET | Freq: Three times a day (TID) | ORAL | Status: DC
  Administered 2023-12-03 – 2023-12-04 (×3): 600 mg via ORAL
  Filled 2023-12-03 (×3): qty 3

## 2023-12-03 MED ORDER — ACETAMINOPHEN 500 MG PO TABS
1000.0000 mg | ORAL_TABLET | Freq: Four times a day (QID) | ORAL | Status: DC
  Administered 2023-12-03 – 2023-12-04 (×3): 1000 mg via ORAL
  Filled 2023-12-03 (×3): qty 2

## 2023-12-03 MED ORDER — OXYCODONE HCL 5 MG PO TABS
5.0000 mg | ORAL_TABLET | ORAL | Status: DC | PRN
  Administered 2023-12-03 – 2023-12-04 (×2): 10 mg via ORAL
  Filled 2023-12-03 (×2): qty 2

## 2023-12-03 NOTE — Plan of Care (Signed)
   Problem: Education: Goal: Knowledge of General Education information will improve Description: Including pain rating scale, medication(s)/side effects and non-pharmacologic comfort measures Outcome: Progressing   Problem: Clinical Measurements: Goal: Will remain free from infection Outcome: Progressing   Problem: Activity: Goal: Risk for activity intolerance will decrease Outcome: Progressing

## 2023-12-03 NOTE — Progress Notes (Signed)
 Pt at  bedrest. Hob elevated. Pt rested after po pain med was administered. No acute change in her status. Pt's spouse spent the night with her. Encouraged to call for assistance as needed. Call light in reach. Sr x2 elevated. Bed in low position.
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
## 2023-12-03 NOTE — Plan of Care (Signed)
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
## 2023-12-03 NOTE — Progress Notes (Signed)
 IP rehab admissions - We have opened the case with [employer] today.  Patient was able to sit up in the chair for 2 hours yesterday.  I will update all once I hear back from insurance case manager.  [phone]

## 2023-12-03 NOTE — Progress Notes (Signed)
 IP rehab admissions - I have approval for CIR admission for tomorrow.  Will plan admit for tomorrow, Saturday.  Rehab MD will see patient in the am to assure readiness.  I will follow up in am.  [phone]
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
## 2023-12-04 ENCOUNTER — Encounter (HOSPITAL_COMMUNITY): Payer: Self-pay | Admitting: Physical Medicine and Rehabilitation

## 2023-12-04 ENCOUNTER — Inpatient Hospital Stay (HOSPITAL_COMMUNITY)
Admission: RE | Admit: 2023-12-04 | Discharge: 2023-12-10 | DRG: 560 | Disposition: A | Discharge status: Home / Self Care | Source: Intra-hospital | Attending: Physical Medicine and Rehabilitation | Admitting: Physical Medicine and Rehabilitation

## 2023-12-04 ENCOUNTER — Other Ambulatory Visit: Payer: Self-pay

## 2023-12-04 DIAGNOSIS — R3911 Hesitancy of micturition: Secondary | ICD-10-CM | POA: Diagnosis not present

## 2023-12-04 DIAGNOSIS — Z7901 Long term (current) use of anticoagulants: Secondary | ICD-10-CM

## 2023-12-04 DIAGNOSIS — N83201 Unspecified ovarian cyst, right side: Secondary | ICD-10-CM | POA: Diagnosis present

## 2023-12-04 DIAGNOSIS — Z6830 Body mass index (BMI) 30.0-30.9, adult: Secondary | ICD-10-CM

## 2023-12-04 DIAGNOSIS — R1110 Vomiting, unspecified: Secondary | ICD-10-CM | POA: Diagnosis not present

## 2023-12-04 DIAGNOSIS — Z791 Long term (current) use of non-steroidal anti-inflammatories (NSAID): Secondary | ICD-10-CM

## 2023-12-04 DIAGNOSIS — Y9355 Activity, bike riding: Secondary | ICD-10-CM | POA: Diagnosis not present

## 2023-12-04 DIAGNOSIS — N83202 Unspecified ovarian cyst, left side: Secondary | ICD-10-CM | POA: Diagnosis not present

## 2023-12-04 DIAGNOSIS — S82141D Displaced bicondylar fracture of right tibia, subsequent encounter for closed fracture with routine healing: Secondary | ICD-10-CM | POA: Diagnosis not present

## 2023-12-04 DIAGNOSIS — Z8249 Family history of ischemic heart disease and other diseases of the circulatory system: Secondary | ICD-10-CM

## 2023-12-04 DIAGNOSIS — Z882 Allergy status to sulfonamides status: Secondary | ICD-10-CM

## 2023-12-04 DIAGNOSIS — Z79899 Other long term (current) drug therapy: Secondary | ICD-10-CM | POA: Diagnosis not present

## 2023-12-04 DIAGNOSIS — M359 Systemic involvement of connective tissue, unspecified: Secondary | ICD-10-CM | POA: Diagnosis not present

## 2023-12-04 DIAGNOSIS — D709 Neutropenia, unspecified: Secondary | ICD-10-CM | POA: Diagnosis present

## 2023-12-04 DIAGNOSIS — M62838 Other muscle spasm: Secondary | ICD-10-CM | POA: Diagnosis not present

## 2023-12-04 DIAGNOSIS — S36899D Unspecified injury of other intra-abdominal organs, subsequent encounter: Secondary | ICD-10-CM

## 2023-12-04 DIAGNOSIS — S2241XD Multiple fractures of ribs, right side, subsequent encounter for fracture with routine healing: Secondary | ICD-10-CM

## 2023-12-04 DIAGNOSIS — S32511D Fracture of superior rim of right pubis, subsequent encounter for fracture with routine healing: Principal | ICD-10-CM

## 2023-12-04 DIAGNOSIS — M5030 Other cervical disc degeneration, unspecified cervical region: Secondary | ICD-10-CM | POA: Diagnosis present

## 2023-12-04 DIAGNOSIS — R911 Solitary pulmonary nodule: Secondary | ICD-10-CM | POA: Diagnosis present

## 2023-12-04 DIAGNOSIS — G4700 Insomnia, unspecified: Secondary | ICD-10-CM | POA: Diagnosis not present

## 2023-12-04 DIAGNOSIS — Z86711 Personal history of pulmonary embolism: Secondary | ICD-10-CM

## 2023-12-04 DIAGNOSIS — K5900 Constipation, unspecified: Secondary | ICD-10-CM | POA: Diagnosis present

## 2023-12-04 DIAGNOSIS — E669 Obesity, unspecified: Secondary | ICD-10-CM | POA: Diagnosis present

## 2023-12-04 DIAGNOSIS — Z8669 Personal history of other diseases of the nervous system and sense organs: Secondary | ICD-10-CM

## 2023-12-04 DIAGNOSIS — D696 Thrombocytopenia, unspecified: Secondary | ICD-10-CM | POA: Diagnosis not present

## 2023-12-04 DIAGNOSIS — D62 Acute posthemorrhagic anemia: Secondary | ICD-10-CM | POA: Diagnosis present

## 2023-12-04 DIAGNOSIS — S3289XA Fracture of other parts of pelvis, initial encounter for closed fracture: Secondary | ICD-10-CM | POA: Diagnosis not present

## 2023-12-04 DIAGNOSIS — E039 Hypothyroidism, unspecified: Secondary | ICD-10-CM | POA: Diagnosis not present

## 2023-12-04 DIAGNOSIS — Z79890 Hormone replacement therapy (postmenopausal): Secondary | ICD-10-CM

## 2023-12-04 DIAGNOSIS — T1490XA Injury, unspecified, initial encounter: Secondary | ICD-10-CM | POA: Diagnosis not present

## 2023-12-04 DIAGNOSIS — M7989 Other specified soft tissue disorders: Secondary | ICD-10-CM | POA: Diagnosis not present

## 2023-12-04 DIAGNOSIS — S82201A Unspecified fracture of shaft of right tibia, initial encounter for closed fracture: Secondary | ICD-10-CM | POA: Diagnosis not present

## 2023-12-04 DIAGNOSIS — Z885 Allergy status to narcotic agent status: Secondary | ICD-10-CM

## 2023-12-04 DIAGNOSIS — Z8679 Personal history of other diseases of the circulatory system: Secondary | ICD-10-CM

## 2023-12-04 DIAGNOSIS — Z881 Allergy status to other antibiotic agents status: Secondary | ICD-10-CM

## 2023-12-04 DIAGNOSIS — R600 Localized edema: Secondary | ICD-10-CM | POA: Diagnosis not present

## 2023-12-04 DIAGNOSIS — S2241XA Multiple fractures of ribs, right side, initial encounter for closed fracture: Secondary | ICD-10-CM | POA: Diagnosis not present

## 2023-12-04 DIAGNOSIS — Z880 Allergy status to penicillin: Secondary | ICD-10-CM

## 2023-12-04 DIAGNOSIS — Z90710 Acquired absence of both cervix and uterus: Secondary | ICD-10-CM

## 2023-12-04 LAB — CBC WITH DIFFERENTIAL/PLATELET
Abs Immature Granulocytes: 0.02 K/uL (ref 0.00–0.07)
Basophils Absolute: 0 K/uL (ref 0.0–0.1)
Basophils Relative: 1 %
Eosinophils Absolute: 0.2 K/uL (ref 0.0–0.5)
Eosinophils Relative: 4 %
HCT: 28.4 % — ABNORMAL LOW (ref 36.0–46.0)
Hemoglobin: 9.4 g/dL — ABNORMAL LOW (ref 12.0–15.0)
Immature Granulocytes: 1 %
Lymphocytes Relative: 19 %
Lymphs Abs: 0.8 K/uL (ref 0.7–4.0)
MCH: 31.5 pg (ref 26.0–34.0)
MCHC: 33.1 g/dL (ref 30.0–36.0)
MCV: 95.3 fL (ref 80.0–100.0)
Monocytes Absolute: 0.3 K/uL (ref 0.1–1.0)
Monocytes Relative: 8 %
Neutro Abs: 2.7 K/uL (ref 1.7–7.7)
Neutrophils Relative %: 67 %
Platelets: 146 K/uL — ABNORMAL LOW (ref 150–400)
RBC: 2.98 MIL/uL — ABNORMAL LOW (ref 3.87–5.11)
RDW: 12.2 % (ref 11.5–15.5)
WBC: 3.9 K/uL — ABNORMAL LOW (ref 4.0–10.5)
nRBC: 0 % (ref 0.0–0.2)

## 2023-12-04 LAB — COMPREHENSIVE METABOLIC PANEL WITH GFR
ALT: 13 U/L (ref 0–44)
AST: 16 U/L (ref 15–41)
Albumin: 2.7 g/dL — ABNORMAL LOW (ref 3.5–5.0)
Alkaline Phosphatase: 31 U/L — ABNORMAL LOW (ref 38–126)
Anion gap: 6 (ref 5–15)
BUN: 10 mg/dL (ref 6–20)
CO2: 27 mmol/L (ref 22–32)
Calcium: 8.5 mg/dL — ABNORMAL LOW (ref 8.9–10.3)
Chloride: 109 mmol/L (ref 98–111)
Creatinine, Ser: 0.86 mg/dL (ref 0.44–1.00)
GFR, Estimated: >60 mL/min
Glucose, Bld: 100 mg/dL — ABNORMAL HIGH (ref 70–99)
Potassium: 4 mmol/L (ref 3.5–5.1)
Sodium: 142 mmol/L (ref 135–145)
Total Bilirubin: 0.5 mg/dL (ref 0.0–1.2)
Total Protein: 5.2 g/dL — ABNORMAL LOW (ref 6.5–8.1)

## 2023-12-04 MED ORDER — FLUTICASONE PROPIONATE 50 MCG/ACT NA SUSP: 2.0000 | Freq: Every day | NASAL | Status: DC | PRN

## 2023-12-04 MED ORDER — POLYETHYLENE GLYCOL 3350 17 G PO PACK
17.0000 g | PACK | Freq: Every day | ORAL | Status: DC | PRN
  Administered 2023-12-05 – 2023-12-10 (×3): 17 g via ORAL
  Filled 2023-12-04 (×3): qty 1

## 2023-12-04 MED ORDER — LIDOCAINE 5 % EX PTCH
2.0000 | MEDICATED_PATCH | CUTANEOUS | Status: DC
  Administered 2023-12-05: 2 via TRANSDERMAL
  Filled 2023-12-04: qty 2

## 2023-12-04 MED ORDER — DOCUSATE SODIUM 100 MG PO CAPS
100.0000 mg | ORAL_CAPSULE | Freq: Two times a day (BID) | ORAL | Status: DC
  Administered 2023-12-04 – 2023-12-09 (×10): 100 mg via ORAL
  Filled 2023-12-04 (×10): qty 1

## 2023-12-04 MED ORDER — TAMSULOSIN HCL 0.4 MG PO CAPS
0.8000 mg | ORAL_CAPSULE | Freq: Every day | ORAL | Status: DC
  Administered 2023-12-05 – 2023-12-09 (×5): 0.8 mg via ORAL
  Filled 2023-12-04 (×5): qty 2

## 2023-12-04 MED ORDER — IBUPROFEN 400 MG PO TABS
600.0000 mg | ORAL_TABLET | Freq: Three times a day (TID) | ORAL | Status: DC
  Administered 2023-12-04 – 2023-12-06 (×6): 600 mg via ORAL
  Filled 2023-12-04 (×6): qty 2

## 2023-12-04 MED ORDER — PROCHLORPERAZINE EDISYLATE 10 MG/2ML IJ SOLN: 5.0000 mg | Freq: Four times a day (QID) | INTRAMUSCULAR | Status: DC | PRN

## 2023-12-04 MED ORDER — POLYETHYLENE GLYCOL 3350 17 G PO PACK: 17.0000 g | PACK | Freq: Every day | ORAL | Status: DC | PRN

## 2023-12-04 MED ORDER — TRAMADOL HCL 50 MG PO TABS: 50.0000 mg | ORAL_TABLET | Freq: Four times a day (QID) | ORAL | Status: DC | PRN

## 2023-12-04 MED ORDER — BISACODYL 10 MG RE SUPP
10.0000 mg | Freq: Every day | RECTAL | Status: DC | PRN
  Administered 2023-12-07: 10 mg via RECTAL
  Filled 2023-12-04: qty 1

## 2023-12-04 MED ORDER — GABAPENTIN 100 MG PO CAPS
100.0000 mg | ORAL_CAPSULE | Freq: Three times a day (TID) | ORAL | Status: DC
  Administered 2023-12-04 – 2023-12-10 (×17): 100 mg via ORAL
  Filled 2023-12-04 (×17): qty 1

## 2023-12-04 MED ORDER — ENOXAPARIN SODIUM 40 MG/0.4ML IJ SOSY
40.0000 mg | PREFILLED_SYRINGE | INTRAMUSCULAR | Status: DC
  Administered 2023-12-05 – 2023-12-10 (×6): 40 mg via SUBCUTANEOUS
  Filled 2023-12-04 (×7): qty 0.4

## 2023-12-04 MED ORDER — ALUM & MAG HYDROXIDE-SIMETH 200-200-20 MG/5ML PO SUSP: 30.0000 mL | ORAL | Status: DC | PRN

## 2023-12-04 MED ORDER — OXYCODONE HCL 5 MG PO TABS
5.0000 mg | ORAL_TABLET | ORAL | Status: DC | PRN
  Administered 2023-12-05: 5 mg via ORAL
  Administered 2023-12-05 – 2023-12-10 (×12): 10 mg via ORAL
  Filled 2023-12-04 (×6): qty 2
  Filled 2023-12-04: qty 1
  Filled 2023-12-04 (×6): qty 2
  Filled 2023-12-04: qty 1
  Filled 2023-12-04: qty 2
  Filled 2023-12-04: qty 1

## 2023-12-04 MED ORDER — TRAZODONE HCL 50 MG PO TABS
100.0000 mg | ORAL_TABLET | Freq: Every day | ORAL | Status: DC
  Administered 2023-12-04 – 2023-12-09 (×6): 100 mg via ORAL
  Filled 2023-12-04 (×6): qty 2

## 2023-12-04 MED ORDER — HYDROXYCHLOROQUINE SULFATE 200 MG PO TABS
400.0000 mg | ORAL_TABLET | Freq: Every day | ORAL | Status: DC
  Administered 2023-12-07 – 2023-12-09 (×3): 400 mg via ORAL
  Filled 2023-12-04 (×7): qty 2

## 2023-12-04 MED ORDER — GUAIFENESIN-DM 100-10 MG/5ML PO SYRP: 5.0000 mL | ORAL_SOLUTION | Freq: Four times a day (QID) | ORAL | Status: DC | PRN

## 2023-12-04 MED ORDER — BUTALBITAL-APAP-CAFFEINE 50-325-40 MG PO TABS
1.0000 | ORAL_TABLET | Freq: Four times a day (QID) | ORAL | Status: DC | PRN
  Administered 2023-12-04: 1 via ORAL
  Filled 2023-12-04: qty 1

## 2023-12-04 MED ORDER — ACETAMINOPHEN 325 MG PO TABS
650.0000 mg | ORAL_TABLET | Freq: Four times a day (QID) | ORAL | Status: DC
  Administered 2023-12-04 – 2023-12-10 (×24): 650 mg via ORAL
  Filled 2023-12-04 (×26): qty 2

## 2023-12-04 MED ORDER — MELATONIN 3 MG PO TABS
3.0000 mg | ORAL_TABLET | Freq: Every evening | ORAL | Status: DC | PRN
  Filled 2023-12-04: qty 1

## 2023-12-04 MED ORDER — FLEET ENEMA RE ENEM
1.0000 | ENEMA | Freq: Once | RECTAL | Status: AC | PRN
  Administered 2023-12-06: 1 via RECTAL
  Filled 2023-12-04: qty 1

## 2023-12-04 MED ORDER — ACETAMINOPHEN 325 MG PO TABS: 325.0000 mg | ORAL_TABLET | ORAL | Status: DC | PRN

## 2023-12-04 MED ORDER — METHOCARBAMOL 1000 MG/10ML IJ SOLN: 500.0000 mg | Freq: Four times a day (QID) | INTRAMUSCULAR | Status: DC

## 2023-12-04 MED ORDER — CITALOPRAM HYDROBROMIDE 20 MG PO TABS
20.0000 mg | ORAL_TABLET | Freq: Every day | ORAL | Status: DC
  Administered 2023-12-05 – 2023-12-10 (×6): 20 mg via ORAL
  Filled 2023-12-04 (×6): qty 1

## 2023-12-04 MED ORDER — METHOCARBAMOL 750 MG PO TABS
750.0000 mg | ORAL_TABLET | Freq: Four times a day (QID) | ORAL | Status: DC
  Administered 2023-12-04 (×2): 750 mg via ORAL
  Filled 2023-12-04 (×3): qty 1

## 2023-12-04 MED ORDER — PROCHLORPERAZINE MALEATE 5 MG PO TABS
5.0000 mg | ORAL_TABLET | Freq: Four times a day (QID) | ORAL | Status: DC | PRN
  Administered 2023-12-05 – 2023-12-07 (×3): 10 mg via ORAL
  Filled 2023-12-04 (×4): qty 2

## 2023-12-04 MED ORDER — METOPROLOL TARTRATE 5 MG/5ML IV SOLN: 5.0000 mg | Freq: Four times a day (QID) | INTRAVENOUS | Status: DC | PRN

## 2023-12-04 MED ORDER — LIDOCAINE HCL URETHRAL/MUCOSAL 2 % EX GEL: CUTANEOUS | Status: DC | PRN

## 2023-12-04 MED ORDER — PROCHLORPERAZINE 25 MG RE SUPP: 12.5000 mg | Freq: Four times a day (QID) | RECTAL | Status: DC | PRN

## 2023-12-04 MED ORDER — LEVOTHYROXINE SODIUM 75 MCG PO TABS
125.0000 ug | ORAL_TABLET | Freq: Every day | ORAL | Status: DC
  Administered 2023-12-05 – 2023-12-10 (×6): 125 ug via ORAL
  Filled 2023-12-04 (×6): qty 1

## 2023-12-04 MED ORDER — DIPHENHYDRAMINE HCL 25 MG PO CAPS: 25.0000 mg | ORAL_CAPSULE | Freq: Four times a day (QID) | ORAL | Status: DC | PRN

## 2023-12-04 MED ORDER — METOPROLOL TARTRATE 12.5 MG HALF TABLET
25.0000 mg | ORAL_TABLET | Freq: Two times a day (BID) | ORAL | Status: DC | PRN
  Filled 2023-12-04: qty 2

## 2023-12-04 NOTE — Progress Notes (Signed)
 Report received from 5N RN awaiting patient to be transported to CIR today.   Randeen Busman, LPN

## 2023-12-04 NOTE — Progress Notes (Signed)
 Inpatient Rehabilitation Admission Medication Review by a Pharmacist  A complete drug regimen review was completed for this patient to identify any potential clinically significant medication issues.  High Risk Drug Classes Is patient taking? Indication by Medication  Antipsychotic Yes, as an intravenous medication Prochlorperazine  for nausea   Anticoagulant Yes Enoxaparin  for DVT prophylaxis  Antibiotic No   Opioid Yes Oxycodone , tramadol  for acute pain   Antiplatelet No   Hypoglycemics/insulin No   Vasoactive Medication Yes  Metoprolol  for HTN or palpitations   Chemotherapy No   Other Yes Acetaminophen  for mild pain Citalopram  for mood Docusate, bisacodyl , miralax , fleets for constipation Gabapentin  for nerve pain Hydroxychloroquine  for connective tissue disease  Ibuprofen , lidocaine  patch for inflammation/pain Levothryoxine for thyroid  supplement Tamsulosin  for urinary hesitancy  Trazodone  for sleep Maalox for indigestion Fioricet  for headache Diphenhydramine  for itching  Fluticasone  for allergies  Robitussin for cough Lidocaine  jelly for I&O cath pain     Type of Medication Issue Identified Description of Issue Recommendation(s)  Drug Interaction(s) (clinically significant)     Duplicate Therapy     Allergy     No Medication Administration End Date     Incorrect Dose     Additional Drug Therapy Needed      Significant med changes from prior encounter (inform family/care partners about these prior to discharge). Held home cyclobenzaprine, vitamins and valacyclovir NEW gabapentin , ibuprofen , lidocaine , methocarbamol , tamsulosin , oxycodone  Review at discharge   Other       Clinically significant medication issues were identified that warrant physician communication and completion of prescribed/recommended actions by midnight of the next day:  No  Name of provider notified for urgent issues identified:   Provider Method of Notification:     Pharmacist  comments:   Time spent performing this drug regimen review (minutes):  20  Dorene Gang, PharmD, BCPS, [facility] Clinical Pharmacist  Please check AMION for all [facility] Pharmacy phone numbers After 10:00 PM, call Main Pharmacy [phone]

## 2023-12-04 NOTE — Progress Notes (Signed)
 IP rehab admissions - patient has been cleared by trauma and rehab MD for acute inpatient rehab admission.  Will admit to CIR today.  [phone]

## 2023-12-04 NOTE — Plan of Care (Signed)
  Problem: Consults Goal: RH GENERAL PATIENT EDUCATION Description: See Patient Education module for education specifics. Outcome: Progressing Goal: Skin Care Protocol Initiated - if Braden Score 18 or less Description: If consults are not indicated, leave blank or document N/A Outcome: Progressing   Problem: RH BOWEL ELIMINATION Goal: RH STG MANAGE BOWEL WITH ASSISTANCE Description: STG Manage Bowel with Mod I Assistance. Outcome: Progressing Goal: RH STG MANAGE BOWEL W/MEDICATION W/ASSISTANCE Description: STG Manage Bowel with Medication with Mod I Assistance. Outcome: Progressing   Problem: RH SKIN INTEGRITY Goal: RH STG MAINTAIN SKIN INTEGRITY WITH ASSISTANCE Description: STG Maintain Skin Integrity With Mod I Assistance. Outcome: Progressing   Problem: RH SAFETY Goal: RH STG ADHERE TO SAFETY PRECAUTIONS W/ASSISTANCE/DEVICE Description: STG Adhere to Safety Precautions With Mod I Assistance/Device. Outcome: Progressing Goal: RH STG DECREASED RISK OF FALL WITH ASSISTANCE Description: STG Decreased Risk of Fall With Mod I Assistance. Outcome: Progressing   Problem: RH KNOWLEDGE DEFICIT GENERAL Goal: RH STG INCREASE KNOWLEDGE OF SELF CARE AFTER HOSPITALIZATION Outcome: Progressing   Problem: RH PAIN MANAGEMENT Goal: RH STG PAIN MANAGED AT OR BELOW PT'S PAIN GOAL Description: < 3 on a 0-10 pain scale Outcome: Progressing

## 2023-12-04 NOTE — Progress Notes (Signed)
 Arrived to CIR today. Husband at bedside. Admission/Assessment complete. Discussed unit protocols, education binder/education board, and schedules/meals. Fluids at bedside, call bell within reach.  Randeen Busman, LPN
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
## 2023-12-05 DIAGNOSIS — T1490XA Injury, unspecified, initial encounter: Secondary | ICD-10-CM

## 2023-12-05 MED ORDER — LIDOCAINE 5 % EX PTCH
3.0000 | MEDICATED_PATCH | CUTANEOUS | Status: DC
  Administered 2023-12-06 – 2023-12-10 (×5): 3 via TRANSDERMAL
  Filled 2023-12-05 (×5): qty 3

## 2023-12-05 MED ORDER — METHOCARBAMOL 500 MG PO TABS
1000.0000 mg | ORAL_TABLET | Freq: Four times a day (QID) | ORAL | Status: DC
  Administered 2023-12-05 – 2023-12-10 (×22): 1000 mg via ORAL
  Filled 2023-12-05 (×23): qty 2

## 2023-12-05 MED ORDER — SORBITOL 70 % SOLN
30.0000 mL | Freq: Once | Status: AC
Start: 2023-12-05 — End: 2023-12-05
  Administered 2023-12-05: 30 mL via ORAL
  Filled 2023-12-05: qty 30

## 2023-12-05 NOTE — Progress Notes (Signed)
 Physical Therapy Session Note  Patient Details  Name: Kathryn Taylor MRN: [id number] Date of Birth: 1960-09-10  Today's Date: 12/05/2023 PT Individual Time: 1300-1355 PT Individual Time Calculation (min): 55 min   Short Term Goals: Week 1:  PT Short Term Goal 1 (Week 1): STGs = LTGs  Skilled Therapeutic Interventions/Progress Updates:  Pt was seen bedside in the pm. Pt had pain medicine prior to treatment. Pt transferred supine to edge of bed with head of bed elevated and S with increased time. Pt tolerated edge of bed with S. Pt transferred sit to stand with rolling walker and min A. Pt transferred to w/c with rolling walker and min A with verbal cues. Pt propelled w/c to gym with B UEs and S. Pt performed car transfers with rolling walker and min A with verbal cues. Pt ambulated 10 feet with rolling walker and min A with verbal cues. Pt propelled w/c back to room with S. Pt transferred w/c to edge of bed with min A and rolling walker. Pt transferred edge of bed to supine with S. Adjusted KI for comfort. Pt left sitting up in bed with all needs within reach and husband at bedside. Increased discomfort and spasms in R hip as treatment progressed and patient fatigued.  Therapy Documentation Precautions:  Precautions Precautions: Fall Recall of Precautions/Restrictions: Intact Required Braces or Orthoses: Knee Immobilizer - Right Knee Immobilizer - Right: On at all times Restrictions Weight Bearing Restrictions Per Provider Order: Yes RLE Weight Bearing Per Provider Order: Non weight bearing   Pain: Pt c/o 1/10 pain R hip/pelvis at beginning of treatment.     Therapy/Group: Individual Therapy  Hassani Sliney G 12/05/2023, 2:00 PM
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
## 2023-12-05 NOTE — Progress Notes (Signed)
 I added Robaxin  - increased to 1000 mg QID, increased lidoderm  to 3 patches and will give Soap suds enema if no BM after sorbitol -

## 2023-12-05 NOTE — Plan of Care (Signed)
  Problem: RH Balance Goal: LTG Patient will maintain dynamic standing with ADLs (OT) Description: LTG:  Patient will maintain dynamic standing balance with assist during activities of daily living (OT)  Flowsheets (Taken 12/05/2023 1251) LTG: Pt will maintain dynamic standing balance during ADLs with: Independent with assistive device   Problem: RH Grooming Goal: LTG Patient will perform grooming w/assist,cues/equip (OT) Description: LTG: Patient will perform grooming with assist, with/without cues using equipment (OT) Flowsheets (Taken 12/05/2023 1251) LTG: Pt will perform grooming with assistance level of: Independent with assistive device    Problem: RH Bathing Goal: LTG Patient will bathe all body parts with assist levels (OT) Description: LTG: Patient will bathe all body parts with assist levels (OT) Flowsheets (Taken 12/05/2023 1251) LTG: Pt will perform bathing with assistance level/cueing: Independent with assistive device    Problem: RH Dressing Goal: LTG Patient will perform upper body dressing (OT) Description: LTG Patient will perform upper body dressing with assist, with/without cues (OT). Flowsheets (Taken 12/05/2023 1251) LTG: Pt will perform upper body dressing with assistance level of: Independent with assistive device Goal: LTG Patient will perform lower body dressing w/assist (OT) Description: LTG: Patient will perform lower body dressing with assist, with/without cues in positioning using equipment (OT) Flowsheets (Taken 12/05/2023 1251) LTG: Pt will perform lower body dressing with assistance level of: Independent with assistive device   Problem: RH Toileting Goal: LTG Patient will perform toileting task (3/3 steps) with assistance level (OT) Description: LTG: Patient will perform toileting task (3/3 steps) with assistance level (OT)  Flowsheets (Taken 12/05/2023 1251) LTG: Pt will perform toileting task (3/3 steps) with assistance level: Independent with assistive  device   Problem: RH Toilet Transfers Goal: LTG Patient will perform toilet transfers w/assist (OT) Description: LTG: Patient will perform toilet transfers with assist, with/without cues using equipment (OT) Flowsheets (Taken 12/05/2023 1251) LTG: Pt will perform toilet transfers with assistance level of: Independent with assistive device   Problem: RH Tub/Shower Transfers Goal: LTG Patient will perform tub/shower transfers w/assist (OT) Description: LTG: Patient will perform tub/shower transfers with assist, with/without cues using equipment (OT) Flowsheets (Taken 12/05/2023 1251) LTG: Pt will perform tub/shower stall transfers with assistance level of: Independent with assistive device
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
## 2023-12-06 DIAGNOSIS — T1490XA Injury, unspecified, initial encounter: Secondary | ICD-10-CM | POA: Diagnosis not present

## 2023-12-06 LAB — CBC
HCT: 26.4 % — ABNORMAL LOW (ref 36.0–46.0)
Hemoglobin: 8.9 g/dL — ABNORMAL LOW (ref 12.0–15.0)
MCH: 32.2 pg (ref 26.0–34.0)
MCHC: 33.7 g/dL (ref 30.0–36.0)
MCV: 95.7 fL (ref 80.0–100.0)
Platelets: 166 10*3/uL (ref 150–400)
RBC: 2.76 MIL/uL — ABNORMAL LOW (ref 3.87–5.11)
RDW: 12.4 % (ref 11.5–15.5)
WBC: 3.4 10*3/uL — ABNORMAL LOW (ref 4.0–10.5)
nRBC: 0 % (ref 0.0–0.2)

## 2023-12-06 LAB — VITAMIN D 25 HYDROXY (VIT D DEFICIENCY, FRACTURES): Vit D, 25-Hydroxy: 46.94 ng/mL (ref 30–100)

## 2023-12-06 MED ORDER — IBUPROFEN 400 MG PO TABS
600.0000 mg | ORAL_TABLET | Freq: Two times a day (BID) | ORAL | Status: DC
  Administered 2023-12-06 – 2023-12-10 (×8): 600 mg via ORAL
  Filled 2023-12-06 (×8): qty 2
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
## 2023-12-06 NOTE — Plan of Care (Signed)
  Problem: Consults Goal: RH GENERAL PATIENT EDUCATION Description: See Patient Education module for education specifics. Outcome: Progressing   Problem: Consults Goal: Skin Care Protocol Initiated - if Braden Score 18 or less Description: If consults are not indicated, leave blank or document N/A Outcome: Progressing   Problem: RH BOWEL ELIMINATION Goal: RH STG MANAGE BOWEL WITH ASSISTANCE Description: STG Manage Bowel with Mod I Assistance. Outcome: Progressing   Problem: RH BOWEL ELIMINATION Goal: RH STG MANAGE BOWEL W/MEDICATION W/ASSISTANCE Description: STG Manage Bowel with Medication with Mod I Assistance. Outcome: Progressing   Problem: RH SAFETY Goal: RH STG ADHERE TO SAFETY PRECAUTIONS W/ASSISTANCE/DEVICE Description: STG Adhere to Safety Precautions With Mod I Assistance/Device. Outcome: Progressing

## 2023-12-06 NOTE — Progress Notes (Signed)
 Physical Therapy Session Note  Patient Details  Name: Kathryn Taylor MRN: [id number] Date of Birth: [birth year]  Today's Date: 12/06/2023 PT Individual Time: 4098-1191 PT Individual Time Calculation (min): 72 min   Short Term Goals: Week 1:  PT Short Term Goal 1 (Week 1): STGs = LTGs  Skilled Therapeutic Interventions/Progress Updates:      Pt presents in bed and agreeable to PT. Reports moderate levels of pain in his R hip and knee - mobility, heat, and repositioning provided for pain management. Spent some time to start building rapport and gathering home setup.   Supine<>sitting EOB with supervision assist with patient using leg lifter loop to manage RLE off the bed. Squat pivot transfer completed with CGA into wheelchair with patient compliant of WB restrictions throughout.   Transported patient to main gym and assisted onto mat table in similar manner. Provided patient with moist heat packs that were applied to her hip and lower buttock for pain management. Pt reports k-pad is being ordered for in-room use. Worked on RLE strengthening and AAROM in long sitting position on mat table - ankle PF with yellow TB resistance, hip abd/add, hip adductor squeezes against ball, and SLR 1x15 each.   Returned to sitting EOM with minA for RLE support. Able to transfer back to w/c with CGA and then back to bed with CGA using hospital bed rail. Left in bed with needs met.    Therapy Documentation Precautions:  Precautions Precautions: Fall Recall of Precautions/Restrictions: Intact Required Braces or Orthoses: Knee Immobilizer - Right Knee Immobilizer - Right: On at all times Restrictions Weight Bearing Restrictions Per Provider Order: Yes RLE Weight Bearing Per Provider Order: Non weight bearing General:    Therapy/Group: Individual Therapy  Pheobe Brass 12/06/2023, 7:56 AM

## 2023-12-06 NOTE — Plan of Care (Signed)
  Problem: RH BOWEL ELIMINATION Goal: RH STG MANAGE BOWEL WITH ASSISTANCE Description: STG Manage Bowel with Mod I Assistance. Outcome: Progressing Goal: RH STG MANAGE BOWEL W/MEDICATION W/ASSISTANCE Description: STG Manage Bowel with Medication with Mod I Assistance. Outcome: Progressing   Problem: RH SKIN INTEGRITY Goal: RH STG MAINTAIN SKIN INTEGRITY WITH ASSISTANCE Description: STG Maintain Skin Integrity With Mod I Assistance. Outcome: Progressing   Problem: RH SAFETY Goal: RH STG ADHERE TO SAFETY PRECAUTIONS W/ASSISTANCE/DEVICE Description: STG Adhere to Safety Precautions With Mod I Assistance/Device. Outcome: Progressing Goal: RH STG DECREASED RISK OF FALL WITH ASSISTANCE Description: STG Decreased Risk of Fall With Mod I Assistance. Outcome: Progressing   Problem: RH KNOWLEDGE DEFICIT GENERAL Goal: RH STG INCREASE KNOWLEDGE OF SELF CARE AFTER HOSPITALIZATION Outcome: Progressing

## 2023-12-06 NOTE — Progress Notes (Signed)
 Occupational Therapy Session Note  Patient Details  Name: Kathryn Taylor MRN: [id number] Date of Birth: Jun 13, 1961  {CHL IP REHAB OT TIME CALCULATIONS:[id number]}   Short Term Goals: Week 1:  OT Short Term Goal 1 (Week 1): Pt will complete LB dressing at CGA with AE as necessary OT Short Term Goal 2 (Week 1): Pt will complete toilet transfer at CGA with LRAD OT Short Term Goal 3 (Week 1): Pt will complete management of bracing and/or education for spouse to complete bracing at mod I  Skilled Therapeutic Interventions/Progress Updates:    Patient agreeable to participate in OT session. Reports *** pain level.   Patient participated in skilled OT session focusing on ***. Therapist facilitated/assessed/developed/educated/integrated/elicited *** in order to improve/facilitate/promote    Therapy Documentation Precautions:  Precautions Precautions: Fall Recall of Precautions/Restrictions: Intact Required Braces or Orthoses: Knee Immobilizer - Right Knee Immobilizer - Right: On at all times Restrictions Weight Bearing Restrictions Per Provider Order: Yes RLE Weight Bearing Per Provider Order: Non weight bearing   Therapy/Group: Individual Therapy  Carollee Circle, OTR/L,CBIS  Supplemental OT - [facility] and [facility] Secure Chat Preferred   12/06/2023, 5:47 PM

## 2023-12-06 NOTE — Progress Notes (Signed)
 Occupational Therapy Session Note  Patient Details  Name: Kathryn Taylor MRN: [id number] Date of Birth: 1961-04-12  Today's Date: 12/06/2023 OT Individual Time: 4098-1191 and 4782-9562 OT Individual Time Calculation (min): 74 min and 55 min   Short Term Goals: Week 1:  OT Short Term Goal 1 (Week 1): Pt will complete LB dressing at CGA with AE as necessary OT Short Term Goal 2 (Week 1): Pt will complete toilet transfer at CGA with LRAD OT Short Term Goal 3 (Week 1): Pt will complete management of bracing and/or education for spouse to complete bracing at mod I  Skilled Therapeutic Interventions/Progress Updates:   1st Session:  Patient received supine in bed.  Worked on sitting up from flat bed.  Patient declined shower - showered yesterday, and showers every 2nd or 3rd day normally.  Patient reports pain in right buttock - describes grabbing, "freezing up"  MD in and discussed muscle relaxants, and pain medication.   Patient able to stand pivot transfer with increased time and contact guard assistance.  Patient transported to ADL apartment to address equipment needs for toilet/ shower at home.  Patient has a BSC, and had a shower seat - likely needs a tub transfer bench.  Demo'd for patient how to transfer into/ out of tub.  Patient has hand held shower head.   Husband assisted with this post recent ankle surgery.  Patient returned to room, indicating need to void - transferred to commode over toilet with CGA and increased time.  Left on commode with nursing staff aware and call cord in reach.     2nd Session:  Patient received supine in bed. Demonstrated squat pivot transfer toward left and patient able to complete without physical assistance following set up.  Patient transported to gym to address transfer - both directions.  With squat pivot method able to transfer R/L.  Showed patient how to manage leg rest, arm rest and brakes of wheelchair.  Used leg lifter to help increase independence with  bed mobility.  Brought in drop arm commode to increase independence.  Patient left up in bed with personal items in reach.   Therapy Documentation Precautions:  Precautions Precautions: Fall Recall of Precautions/Restrictions: Intact Required Braces or Orthoses: Knee Immobilizer - Right Knee Immobilizer - Right: On at all times Restrictions Weight Bearing Restrictions Per Provider Order: Yes RLE Weight Bearing Per Provider Order: Non weight bearing   Vital Signs: Therapy Vitals BP: 119/82 1st session   Pain: Pain Assessment Pain Scale: 0-10 Pain Score: 8  Pain Type: Acute pain Pain Location: Hip Pain Orientation: Right Pain Intervention(s): Medication (See eMAR);Repositioned;Rest  2nd session:   Pain Assessment Pain Scale: 0-10 Pain Score: 8  Pain Type: Acute pain Pain Location: Hip Pain Orientation: Right Pain Intervention(s): Medication (See eMAR);Repositioned;Rest  ADL: ADL Equipment Provided: Reacher, Long-handled sponge Eating: Set up Where Assessed-Eating: Bed level Grooming: Setup Where Assessed-Grooming: Sitting at sink, Wheelchair Upper Body Bathing: Supervision/safety Where Assessed-Upper Body Bathing: Shower Lower Body Bathing: Supervision/safety Where Assessed-Lower Body Bathing: Shower Upper Body Dressing: Setup Where Assessed-Upper Body Dressing: Wheelchair Lower Body Dressing: Moderate assistance Where Assessed-Lower Body Dressing: Wheelchair, Standing at sink Toileting: Moderate assistance Where Assessed-Toileting: Toilet, Psychiatrist Transfer: Moderate assistance Toilet Transfer Method: Stand pivot Toilet Transfer Equipment: Bedside commode Tub/Shower Transfer: Unable to assess Tub/Shower Transfer Method: Unable to assess Film/video editor: Moderate assistance Film/video editor Method: Administrator: Emergency planning/management officer, Grab bars ADL Comments: Enouragement provided during transfers d/t increased  pain, OT encourageed pt to take her time to limit pain, pt with good carryover. OT kept KI on during bathing, covering with trash bag. Pt able to thread pants over BLE, required assistance in standing to manage pover hips, attempted to assist with increased pain in inbtermittent unilateral UE unsupported standing at RW. Pt with order i8n for k-pad. OT messaging team for bledsoe brace for increased ease fopr donning/doffing. Vision   Perception    Praxis   Balance   Exercises:   Other Treatments:     Therapy/Group: Individual Therapy  Verlie Hellenbrand M 12/06/2023, 10:41 AM
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
## 2023-12-06 NOTE — Progress Notes (Signed)
 Inpatient Rehabilitation  Patient information reviewed and entered into eRehab system by [facility]. Karen Kays., CCC/SLP, PPS Coordinator.  Information including medical coding, functional ability and quality indicators will be reviewed and updated through discharge.

## 2023-12-06 NOTE — Progress Notes (Signed)
 Inpatient Rehabilitation Center Individual Statement of Services  Patient Name:  Kathryn Taylor  Date:  12/06/2023  Welcome to the Inpatient Rehabilitation Center.  Our goal is to provide you with an individualized program based on your diagnosis and situation, designed to meet your specific needs.  With this comprehensive rehabilitation program, you will be expected to participate in at least 3 hours of rehabilitation therapies Monday-Friday, with modified therapy programming on the weekends.  Your rehabilitation program will include the following services:  Physical Therapy (PT), Occupational Therapy (OT), 24 hour per day rehabilitation nursing, Therapeutic Recreaction (TR), Care Coordinator, Rehabilitation Medicine, Nutrition Services, and Pharmacy Services  Weekly team conferences will be held on Wednesday to discuss your progress.  Your Inpatient Rehabilitation Care Coordinator will talk with you frequently to get your input and to update you on team discussions.  Team conferences with you and your family in attendance may also be held.  Expected length of stay: 7-10 days  Overall anticipated outcome: supervision-mod/I level  Depending on your progress and recovery, your program may change. Your Inpatient Rehabilitation Care Coordinator will coordinate services and will keep you informed of any changes. Your Inpatient Rehabilitation Care Coordinator's name and contact numbers are listed  below.  The following services may also be recommended but are not provided by the Inpatient Rehabilitation Center:  Driving Evaluations Home Health Rehabiltiation Services Outpatient Rehabilitation Service   Arrangements will be made to provide these services after discharge if needed.  Arrangements include referral to agencies that provide these services.  Your insurance has been verified to be:  BCBC Your primary doctor is:  Statistician  Pertinent information will be shared with your doctor and your  insurance company.  Inpatient Rehabilitation Care Coordinator:  Adrianna Albee, Buzz Cass [phone] or Justine Oms  Information discussed with and copy given to patient by: Mardell Shade, 12/06/2023, 10:05 AM

## 2023-12-07 DIAGNOSIS — T1490XA Injury, unspecified, initial encounter: Secondary | ICD-10-CM | POA: Diagnosis not present

## 2023-12-07 LAB — CBC WITH DIFFERENTIAL/PLATELET
Abs Immature Granulocytes: 0.01 10*3/uL (ref 0.00–0.07)
Basophils Absolute: 0 10*3/uL (ref 0.0–0.1)
Basophils Relative: 1 %
Eosinophils Absolute: 0.2 10*3/uL (ref 0.0–0.5)
Eosinophils Relative: 5 %
HCT: 27 % — ABNORMAL LOW (ref 36.0–46.0)
Hemoglobin: 8.9 g/dL — ABNORMAL LOW (ref 12.0–15.0)
Immature Granulocytes: 0 %
Lymphocytes Relative: 37 %
Lymphs Abs: 1.1 10*3/uL (ref 0.7–4.0)
MCH: 31.4 pg (ref 26.0–34.0)
MCHC: 33 g/dL (ref 30.0–36.0)
MCV: 95.4 fL (ref 80.0–100.0)
Monocytes Absolute: 0.3 10*3/uL (ref 0.1–1.0)
Monocytes Relative: 8 %
Neutro Abs: 1.4 10*3/uL — ABNORMAL LOW (ref 1.7–7.7)
Neutrophils Relative %: 49 %
Platelets: 184 10*3/uL (ref 150–400)
RBC: 2.83 MIL/uL — ABNORMAL LOW (ref 3.87–5.11)
RDW: 12.3 % (ref 11.5–15.5)
WBC: 3 10*3/uL — ABNORMAL LOW (ref 4.0–10.5)
nRBC: 0 % (ref 0.0–0.2)

## 2023-12-07 LAB — OCCULT BLOOD X 1 CARD TO LAB, STOOL: Fecal Occult Bld: NEGATIVE

## 2023-12-07 MED ORDER — MAGNESIUM CITRATE PO SOLN
1.0000 | Freq: Once | ORAL | Status: AC
  Administered 2023-12-07: 1 via ORAL
  Filled 2023-12-07: qty 296

## 2023-12-07 MED ORDER — VITAMIN D 25 MCG (1000 UNIT) PO TABS
1000.0000 [IU] | ORAL_TABLET | Freq: Every day | ORAL | Status: DC
  Administered 2023-12-07 – 2023-12-10 (×4): 1000 [IU] via ORAL
  Filled 2023-12-07 (×4): qty 1

## 2023-12-07 NOTE — Progress Notes (Signed)
 Stool sample collected and sent to lab per MD order for occult stool.   Randeen Busman, LPN

## 2023-12-07 NOTE — Plan of Care (Signed)
  Problem: Consults Goal: RH GENERAL PATIENT EDUCATION Description: See Patient Education module for education specifics. Outcome: Progressing Goal: Skin Care Protocol Initiated - if Braden Score 18 or less Description: If consults are not indicated, leave blank or document N/A Outcome: Progressing   Problem: RH BOWEL ELIMINATION Goal: RH STG MANAGE BOWEL WITH ASSISTANCE Description: STG Manage Bowel with Mod I Assistance. Outcome: Progressing Goal: RH STG MANAGE BOWEL W/MEDICATION W/ASSISTANCE Description: STG Manage Bowel with Medication with Mod I Assistance. Outcome: Progressing   Problem: RH SKIN INTEGRITY Goal: RH STG MAINTAIN SKIN INTEGRITY WITH ASSISTANCE Description: STG Maintain Skin Integrity With Mod I Assistance. Outcome: Progressing   Problem: RH SAFETY Goal: RH STG ADHERE TO SAFETY PRECAUTIONS W/ASSISTANCE/DEVICE Description: STG Adhere to Safety Precautions With Mod I Assistance/Device. Outcome: Progressing Goal: RH STG DECREASED RISK OF FALL WITH ASSISTANCE Description: STG Decreased Risk of Fall With Mod I Assistance. Outcome: Progressing   Problem: RH KNOWLEDGE DEFICIT GENERAL Goal: RH STG INCREASE KNOWLEDGE OF SELF CARE AFTER HOSPITALIZATION Outcome: Progressing
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
## 2023-12-07 NOTE — Progress Notes (Signed)
 Physical Therapy Session Note  Patient Details  Name: Kathryn Taylor MRN: [id number] Date of Birth: 1960/09/26  Today's Date: 12/07/2023 PT Missed Time: 45 Minutes Missed Time Reason: Patient ill (Comment)  Pt reporting feeling noxious and sick - has a cool washcloth over forehead and actively throwing up in emesis bag. Pt unable to participate in therapy at this time. Missed 45 minutes of therapy - LPN made aware.      Therapy/Group: Individual Therapy  Pheobe Brass 12/07/2023, 7:42 AM
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
## 2023-12-07 NOTE — Progress Notes (Signed)
 Physical Therapy Session Note  Patient Details  Name: Noriah Osgood MRN: [id number] Date of Birth: 1960-11-27  Today's Date: 12/07/2023 PT Individual Time: 1030-1130 and 4098-1191 PT Individual Time Calculation (min): 60 min and 59 min.  Short Term Goals: Week 1:  PT Short Term Goal 1 (Week 1): STGs = LTGs  Skilled Therapeutic Interventions/Progress Updates:   First session:  Pt presents semi-reclined in bed and agreeable to therapy.  Pt uses leg lifter to bring RLE to EOB.  Pt performs squat pivot bed > w/c w/ CGA and cues for hand placement and sequencing to maintain NWB RLE.  Pt wheeled to small gym w/ supervision.  Pt performed squat pivot to mat table w/ CGA and cues.  Pt requires manual A to move RLE.  Pt performed AP and then abd/add RLE w/ AAROM for friction relief.  Pt requires rest breaks 2/2 pain to R hip/groin area.  Pt performed L HS. Pt educated on bridging w/ LLE to assist w/ movement and given assurances that strength will return w/ continued use.  Pt transferred back to w/c w/ CGA and to room.  Pt performed squat pivot to [facility] and handed over to NT.  Second session:  Pt presents sitting EOB after nursing care given.  Pt states feeling better and wishes to proceed w/ therapy.  Pt transfers bed > w/c w/ CGA.  Pt wheels to elevators and PT completes transport to outdoors.  Pt removes leg rests w/ min cues.  Pt performed sit to stand w/ min A and performed hip flexion w/ increased pain and c/o lightheadedness.  Pt returned to sitting and performed PLB and AP.  Pt still continues w/ lightheadedness and returns to inside w/ rapid improvement in Air Conditioned setting.  Pt wheeled upstairs and performed 5' x 2 at level 1 on UBE pacing at 30 rpm.  Pt performed 265 rpm.  Pt returned to room and removed leg rests w/ assist for elevating leg rest 2/2 weight.  Pt performed squat pivot w/ CGA and then min A for RLE onto bed and educated on floating R heel.  All needs in reach, 4 railings up per  request.     Therapy Documentation Precautions:  Precautions Precautions: Fall Recall of Precautions/Restrictions: Intact Required Braces or Orthoses: Knee Immobilizer - Right Knee Immobilizer - Right: On at all times Restrictions Weight Bearing Restrictions Per Provider Order: Yes RLE Weight Bearing Per Provider Order: Non weight bearing General:   Vital Signs:   Pain:1/10, 4/10 w/ activity. 2nd session: 8/10 w/ activity, w/o pain meds. Pain Assessment Pain Score: 0-No pain   Therapy/Group: Individual Therapy  Sehaj Mcenroe P Artavis Cowie 12/07/2023, 11:36 AM

## 2023-12-08 DIAGNOSIS — T1490XA Injury, unspecified, initial encounter: Secondary | ICD-10-CM | POA: Diagnosis not present

## 2023-12-08 LAB — CBC WITH DIFFERENTIAL/PLATELET
Abs Immature Granulocytes: 0.01 10*3/uL (ref 0.00–0.07)
Basophils Absolute: 0 10*3/uL (ref 0.0–0.1)
Basophils Relative: 1 %
Eosinophils Absolute: 0.1 10*3/uL (ref 0.0–0.5)
Eosinophils Relative: 4 %
HCT: 28.1 % — ABNORMAL LOW (ref 36.0–46.0)
Hemoglobin: 9 g/dL — ABNORMAL LOW (ref 12.0–15.0)
Immature Granulocytes: 0 %
Lymphocytes Relative: 36 %
Lymphs Abs: 1.1 10*3/uL (ref 0.7–4.0)
MCH: 31 pg (ref 26.0–34.0)
MCHC: 32 g/dL (ref 30.0–36.0)
MCV: 96.9 fL (ref 80.0–100.0)
Monocytes Absolute: 0.3 10*3/uL (ref 0.1–1.0)
Monocytes Relative: 10 %
Neutro Abs: 1.5 10*3/uL — ABNORMAL LOW (ref 1.7–7.7)
Neutrophils Relative %: 49 %
Platelets: 211 10*3/uL (ref 150–400)
RBC: 2.9 MIL/uL — ABNORMAL LOW (ref 3.87–5.11)
RDW: 12.4 % (ref 11.5–15.5)
WBC: 3 10*3/uL — ABNORMAL LOW (ref 4.0–10.5)
nRBC: 0 % (ref 0.0–0.2)

## 2023-12-08 NOTE — Plan of Care (Signed)
 DC ambulation goals due to pain  Problem: RH Ambulation Goal: LTG Patient will ambulate in controlled environment (PT) Description: LTG: Patient will ambulate in a controlled environment, # of feet with assistance (PT). Outcome: Not Applicable Goal: LTG Patient will ambulate in home environment (PT) Description: LTG: Patient will ambulate in home environment, # of feet with assistance (PT). Outcome: Not Applicable
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
## 2023-12-08 NOTE — Progress Notes (Signed)
 Physical Therapy Session Note  Patient Details  Name: Kathryn Taylor MRN: [id number] Date of Birth: 12/16/1960  Today's Date: 12/08/2023 PT Individual Time: 0900-0959 PT Individual Time Calculation (min): 59 min   Short Term Goals: Week 1:  PT Short Term Goal 1 (Week 1): STGs = LTGs  Skilled Therapeutic Interventions/Progress Updates:      Pt in bed to start with KI on RLE - pt reports moderate levels of pelvic pain and leg pain - mobility and heat used to assist with pain management. Spent some time discussing home setup, DC planning, DME rec's, and anticipated f/u PT recommendations. Focused session on car transfers, lateral hopping to enter her wateroom with toilet, and managing her pain with heat.   Bed mobility completed with supervision with use of leg lifter for her RLE, ++ time for pain. Squat pivot transfer completed with seutpA for wheelchair management. Transported at w/c level to ortho gym to work on Conservator, museum/gallery. Pt reports husband measured 31" from ground to top of seat height for their [employer] - car simulator adjusted to height. Pt reports height is too tall and also car simulator is too unlike her car. Offered to complete car transfer with their personal vehicle - suggestion of outside [facility] at valet parking tomorrow at 10:00 with family education and training - wife to relay to husband and will also relay to CSW during team conf.   Next, instructed her in lateral and straight path hopping using the RW. Pt able to hop ~3-4 feet with CGA/minA and RW but patient moaning in pain, reporting "excruciating" pelvic pain with small hopping. Deferred any further gait training due to pain.   Worked in long sitting position with back support from phyisoball and moist heat packs applied to R hip/pelvis for pain support. Worked on AAROM for hip abd/add and SLR 2x10 each. Removed KI to assess skin - no breakdown noted.   Returned to her room and patient requesting to return to bed to rest -  supervision for squat pivot transfer back to bed. Able to get LE into bed without assist. Left with needs met.  Therapy Documentation Precautions:  Precautions Precautions: Fall Recall of Precautions/Restrictions: Intact Required Braces or Orthoses: Knee Immobilizer - Right Knee Immobilizer - Right: On at all times Restrictions Weight Bearing Restrictions Per Provider Order: Yes RLE Weight Bearing Per Provider Order: Non weight bearing General:    Therapy/Group: Individual Therapy  Pheobe Brass 12/08/2023, 7:39 AM
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
## 2023-12-08 NOTE — Progress Notes (Signed)
 Occupational Therapy Session Note  Patient Details  Name: Kathryn Taylor MRN: [id number] Date of Birth: Oct 31, 1960  Today's Date: 12/08/2023 OT Individual Time: 1300-1411 OT Individual Time Calculation (min): 71 min    Short Term Goals: Week 1:  OT Short Term Goal 1 (Week 1): Pt will complete LB dressing at CGA with AE as necessary OT Short Term Goal 2 (Week 1): Pt will complete toilet transfer at CGA with LRAD OT Short Term Goal 3 (Week 1): Pt will complete management of bracing and/or education for spouse to complete bracing at mod I  Skilled Therapeutic Interventions/Progress Updates:   Patient received supine in bed.  Patient excited to practice tub shower transfer to determine specific equipment needs - tub transfer bench, versus padded tub bench.  Patient able to transfer to TTB with verbal cueing and increased time.  Patient did not want to practice padded transfer bench - stating it looked less stable.  Patient does not seem bothered by solid firm surface of standard tub bench.  Practiced drop arm commode transfer with regular sized commode.  Patient had been using wide BSC.  Helpful to allow patient time to problem solve set up and breakdown of wheelchair, commode for transfer.  Discussed benefit of leg lifter, and suction grab bar.  Patient asked to review equipment in her cart to ensure she was getting correct supplies.  Patient returned to bed at end of session, requesting all 4 rails up.  Bed alarm engaged and personal items in reach.    Therapy Documentation Precautions:  Precautions Precautions: Fall Recall of Precautions/Restrictions: Intact Required Braces or Orthoses: Knee Immobilizer - Right Knee Immobilizer - Right: On at all times Restrictions Weight Bearing Restrictions Per Provider Order: Yes RLE Weight Bearing Per Provider Order: Non weight bearing  Pain: Pain Assessment Pain Scale: 0-10 Pain Score: 2  Pain Location: Leg Pain Intervention(s): Medication (See  eMAR)    Therapy/Group: Individual Therapy  [name] M 12/08/2023, 2:13 PM

## 2023-12-08 NOTE — Progress Notes (Signed)
 Recreational Therapy Session Note  Patient Details  Name: Kathryn Taylor MRN: [id number] Date of Birth: Apr 20, 1961 Today's Date: 12/08/2023  Pain:no c/o  Pt participated in animal assisted activity bed level with supervision.  Pt easily engaged with pet partner team and was appreciative of this visit.  Pt stated  this was the best part of her day.    Takeela Peil 12/08/2023, 2:57 PM

## 2023-12-08 NOTE — Progress Notes (Signed)
 Patient ID: Kathryn Taylor, female   DOB: 1961/03/22, 63 y.o.   MRN: [id number]  Met with pt to give team conference update regarding goals of mod/I-supervision wheelchair level and discharge target 4/26. Husband to come in tomorrow to do actual car transfer and see her in therapies in preparation for discharge on Sat. Discussed equipment needs of drop-arm bedside commode. Also will need follow up when can WB, which she is agreeable too and to save her visits. See tomorrow when husband here for any questions.
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
## 2023-12-09 ENCOUNTER — Other Ambulatory Visit (HOSPITAL_COMMUNITY): Payer: Self-pay

## 2023-12-09 DIAGNOSIS — T1490XA Injury, unspecified, initial encounter: Secondary | ICD-10-CM | POA: Diagnosis not present

## 2023-12-09 LAB — CBC
HCT: 29 % — ABNORMAL LOW (ref 36.0–46.0)
Hemoglobin: 9.5 g/dL — ABNORMAL LOW (ref 12.0–15.0)
MCH: 31.6 pg (ref 26.0–34.0)
MCHC: 32.8 g/dL (ref 30.0–36.0)
MCV: 96.3 fL (ref 80.0–100.0)
Platelets: 236 10*3/uL (ref 150–400)
RBC: 3.01 MIL/uL — ABNORMAL LOW (ref 3.87–5.11)
RDW: 12.5 % (ref 11.5–15.5)
WBC: 3.2 10*3/uL — ABNORMAL LOW (ref 4.0–10.5)
nRBC: 0 % (ref 0.0–0.2)

## 2023-12-09 LAB — BASIC METABOLIC PANEL WITH GFR
Anion gap: 10 (ref 5–15)
BUN: 12 mg/dL (ref 6–20)
CO2: 23 mmol/L (ref 22–32)
Calcium: 8.7 mg/dL — ABNORMAL LOW (ref 8.9–10.3)
Chloride: 106 mmol/L (ref 98–111)
Creatinine, Ser: 0.8 mg/dL (ref 0.44–1.00)
GFR, Estimated: >60 mL/min (ref >60)
Glucose, Bld: 102 mg/dL — ABNORMAL HIGH (ref 70–99)
Potassium: 3.8 mmol/L (ref 3.5–5.1)
Sodium: 139 mmol/L (ref 135–145)

## 2023-12-09 MED ORDER — LIDOCAINE 5 % EX PTCH
3.0000 | MEDICATED_PATCH | CUTANEOUS | 0 refills | Status: AC
Start: 2023-12-10 — End: ?
  Filled 2023-12-09: qty 90, 30d supply, fill #0

## 2023-12-09 MED ORDER — DOCUSATE SODIUM 100 MG PO CAPS
100.0000 mg | ORAL_CAPSULE | Freq: Three times a day (TID) | ORAL | Status: DC
  Administered 2023-12-09 – 2023-12-10 (×3): 100 mg via ORAL
  Filled 2023-12-09 (×3): qty 1

## 2023-12-09 MED ORDER — DOCUSATE SODIUM 100 MG PO CAPS
100.0000 mg | ORAL_CAPSULE | Freq: Three times a day (TID) | ORAL | 0 refills | Status: AC
Start: 2023-12-09 — End: ?
  Filled 2023-12-09: qty 90, 30d supply, fill #0

## 2023-12-09 MED ORDER — OXYCODONE HCL 5 MG PO TABS
5.0000 mg | ORAL_TABLET | Freq: Three times a day (TID) | ORAL | 0 refills | Status: DC | PRN
Start: 2023-12-09 — End: 2023-12-14
  Filled 2023-12-09: qty 35, 6d supply, fill #0

## 2023-12-09 MED ORDER — ACETAMINOPHEN EXTRA STRENGTH 500 MG PO TABS
ORAL_TABLET | Freq: Three times a day (TID) | ORAL | 0 refills | Status: AC
  Filled 2023-12-09: qty 30, 8d supply, fill #0

## 2023-12-09 MED ORDER — TAMSULOSIN HCL 0.4 MG PO CAPS
0.8000 mg | ORAL_CAPSULE | Freq: Every day | ORAL | 0 refills | Status: AC
  Filled 2023-12-09: qty 60, 30d supply, fill #0

## 2023-12-09 MED ORDER — GABAPENTIN 100 MG PO CAPS
100.0000 mg | ORAL_CAPSULE | Freq: Three times a day (TID) | ORAL | 0 refills | Status: AC
  Filled 2023-12-09: qty 90, 30d supply, fill #0

## 2023-12-09 MED ORDER — VITAMIN D3 25 MCG PO TABS
1000.0000 [IU] | ORAL_TABLET | Freq: Every day | ORAL | 0 refills | Status: AC
  Filled 2023-12-09: qty 30, 30d supply, fill #0

## 2023-12-09 MED ORDER — MELATONIN 3 MG PO TABS: 3.0000 mg | ORAL_TABLET | Freq: Every evening | ORAL | Status: AC | PRN

## 2023-12-09 NOTE — Progress Notes (Signed)
 Occupational Therapy Discharge Summary  Patient Details  Name: Kathryn Taylor MRN: [id number] Date of Birth: [birth year]  Date of Discharge from OT service:December 09, 2023  Today's Date: 12/09/2023 OT Individual Time: 4742-5956 OT Individual Time Calculation (min): 38 min   Skilled Therapeutic Intervention: Patient received supine in bed.  Husband at bedside.  Husband asking if inspirometer still required - recommended patient continue to use for now.  Patient asking about aquatic therapy to allow her to exercise in her pool.  Patient instructed in opening knee immobilizer and caring for RLE.  Patient able to wash, dry and lotion leg with assist only to maintain splinted position.  Patient asking about knot on left ankle - sent secure message to MD who has ordered imaging, and this should occur prior to discharge.  Left patient in bed with husband at bedside and personal items in reach.   Patient has met 7 of 8 long term goals due to improved activity tolerance, improved balance, ability to compensate for deficits.  Patient to discharge at overall Modified Independent level.  Patient's care partner is independent to provide the necessary physical assistance at discharge.    Reasons goals not met: NA  Recommendation:  Patient will benefit from ongoing skilled OT services in outpatient setting to continue to advance functional skills in the area of BADL, iADL, and Reduce care partner burden.  Equipment: Drop arm commode, recommended patient purchase tub transfer bench, grab bars, leg lifter  Reasons for discharge: treatment goals met and discharge from hospital  Patient/family agrees with progress made and goals achieved: Yes  OT Discharge Precautions/Restrictions  Precautions Precautions: Fall Recall of Precautions/Restrictions: Intact Required Braces or Orthoses: Knee Immobilizer - Right Knee Immobilizer - Right: On at all times Restrictions Weight Bearing Restrictions Per Provider  Order: Yes RLE Weight Bearing Per Provider Order: Non weight bearing   Pain Pain Assessment Pain Scale: 0-10 Pain Score: 0-No pain Pain Location: Leg Pain Intervention(s): Medication (See eMAR);Other (Comment) (scheduled meds) ADL ADL Equipment Provided: Reacher, Long-handled sponge, Leg lifter Eating: Independent Where Assessed-Eating: Chair Grooming: Independent Where Assessed-Grooming: Sitting at sink Upper Body Bathing: Modified independent Where Assessed-Upper Body Bathing: Shower Lower Body Bathing: Modified independent Where Assessed-Lower Body Bathing: Shower Upper Body Dressing: Independent Where Assessed-Upper Body Dressing: Edge of bed Lower Body Dressing: Modified independent Where Assessed-Lower Body Dressing: Bed level, Edge of bed Toileting: Modified independent Where Assessed-Toileting: Toilet, Bedside Commode Toilet Transfer: Modified independent Statistician Method: Ambulance person: Drop arm bedside commode Tub/Shower Transfer: Modified independent Web designer Method: Conservation officer, historic buildings: Insurance underwriter: Modified independent Film/video editor Method: Warden/ranger: Emergency planning/management officer, Grab bars ADL Comments: Enouragement provided during transfers d/t increased pain, OT encourageed pt to take her time to limit pain, pt with good carryover. OT kept KI on during bathing, covering with trash bag. Pt able to thread pants over BLE, required assistance in standing to manage pover hips, attempted to assist with increased pain in inbtermittent unilateral UE unsupported standing at RW. Pt with order i8n for k-pad. OT messaging team for bledsoe brace for increased ease fopr donning/doffing. Vision Baseline Vision/History: 1 Wears glasses Patient Visual Report: No change from baseline Vision Assessment?: No apparent visual deficits Perception  Perception: Within  Functional Limits Praxis Praxis: WFL Cognition Cognition Overall Cognitive Status: Within Functional Limits for tasks assessed Arousal/Alertness: Awake/alert Orientation Level: Person;Place;Situation Person: Oriented Place: Oriented Situation: Oriented Memory: Appears intact Attention: Alternating Awareness: Appears  intact Problem Solving: Appears intact Safety/Judgment: Appears intact Brief Interview for Mental Status (BIMS) Repetition of Three Words (First Attempt): 3 Temporal Orientation: Year: Correct Temporal Orientation: Month: Accurate within 5 days Temporal Orientation: Day: Correct Recall: "Sock": Yes, no cue required Recall: "Blue": Yes, no cue required Recall: "Bed": Yes, no cue required BIMS Summary Score: 15 Sensation Sensation Light Touch: Appears Intact Hot/Cold: Appears Intact Proprioception: Appears Intact Stereognosis: Appears Intact Additional Comments: mild chronic numbness dorsum left foot from prior foot surgery Coordination Gross Motor Movements are Fluid and Coordinated: No Fine Motor Movements are Fluid and Coordinated: Yes Motor  Motor Motor - Discharge Observations: limited rotation due to hip and pelvic pain, KI AAT's Mobility  Bed Mobility Bed Mobility: Supine to Sit;Sit to Supine Supine to Sit: Independent Sit to Supine: Independent Transfers Sit to Stand: Set up assist;Independent with assistive device  Trunk/Postural Assessment  Cervical Assessment Cervical Assessment: Within Functional Limits Thoracic Assessment Thoracic Assessment: Within Functional Limits Lumbar Assessment Lumbar Assessment: Within Functional Limits Postural Control Postural Control: Within Functional Limits  Balance Balance Balance Assessed: Yes Static Sitting Balance Static Sitting - Balance Support: Right upper extremity supported;Left upper extremity supported;Feet supported Static Sitting - Level of Assistance: 7: Independent Dynamic Sitting  Balance Dynamic Sitting - Balance Support: Right upper extremity supported;Left upper extremity supported;Feet supported;During functional activity Dynamic Sitting - Level of Assistance: 7: Independent Static Standing Balance Static Standing - Balance Support: Bilateral upper extremity supported;During functional activity Static Standing - Level of Assistance: 4: Min assist Dynamic Standing Balance Dynamic Standing - Balance Support: During functional activity;Bilateral upper extremity supported Dynamic Standing - Level of Assistance: 4: Min assist Extremity/Trunk Assessment RUE Assessment RUE Assessment: Within Functional Limits LUE Assessment LUE Assessment: Within Functional Limits   Mila Alexandria 12/09/2023, 1:40 PM

## 2023-12-09 NOTE — Progress Notes (Signed)
 Inpatient Rehabilitation Care Coordinator Discharge Note   Patient Details  Name: Kathryn Taylor MRN: [id number] Date of Birth: April 16, 1961   Discharge location: HOME WITH HUSBAND WHO CAN PROVIDE 24/7 CARE  Length of Stay: 6 DAYS  Discharge activity level: MOD/I WHEELCHAIR LEVEL  Home/community participation: ACTIVE  Patient response NW:[id number] Literacy - How often do you need to have someone help you when you read instructions, pamphlets, or other written material from your doctor or pharmacy?: Never  Patient response HY:[id number] Isolation - How often do you feel lonely or isolated from those around you?: Never  Services provided included: MD, RD, PT, OT, RN, CM, Pharmacy, SW  Financial Services:  Field seismologist Utilized: Barrister's clerk  Choices offered to/list presented to: PT AND HUSBAND  Follow-up services arranged:  DME, Patient/Family has no preference for HH/DME agencies      DME : [facility]  DROP-ARM BEDSIDE COMMODE  WILL NEED OUTPATIENT REHAB ONCE CAN WB AND BE ABLE TO HOP ON HER FRACTURED PELVIS  Patient response to transportation need: Is the patient able to respond to transportation needs?: Yes In the past 12 months, has lack of transportation kept you from medical appointments or from getting medications?: No In the past 12 months, has lack of transportation kept you from meetings, work, or from getting things needed for daily living?: No   Patient/Family verbalized understanding of follow-up arrangements:  Yes  Individual responsible for coordination of the follow-up plan: SELF AND ALEC-HUSBAND [phone]  Confirmed correct DME delivered: Mardell Shade 12/09/2023    Comments (or additional information):HUSBAND WAS IN FOR FAMILY TRAINING AND IT WENT WELL. WILL NEED OP REHAB ONCE CAN WB AND HOP BETTER WITH HER FRACTURED PELVIS.  Summary of Stay    Date/Time Discharge Planning CSW  12/08/23 1002 Home with husband who is able to provide 24/7  care if needed. Pt is limited in her function due to WB issues. Await team's recommendations for DME RGD       Amorette Charrette G
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
## 2023-12-09 NOTE — Progress Notes (Signed)
 Physical Therapy Discharge Summary  Patient Details  Name: Kathryn Taylor MRN: [id number] Date of Birth: 04/17/1961  Date of Discharge from PT service:December 09, 2023  Patient has met 8 of 8 long term goals due to improved activity tolerance, improved balance, improved postural control, increased strength, increased range of motion, decreased pain, and ability to compensate for deficits.  Patient to discharge at a wheelchair level Modified Independent.   Patient's care partner is independent to provide the necessary physical assistance at discharge.  Reasons goals not met: n/a (ambulation goals were DC due to severity of pain in standing or "hopping" with RW)  Recommendation:  Patient will benefit from ongoing skilled PT services in outpatient setting once WB restrictions are lifted to continue to advance safe functional mobility, address ongoing impairments in pain management, ROM in RLE, functional mobility, standing balance pre-gait and gait training, and minimize fall risk.  Equipment: No equipment provided - pt owns all needed DME  Reasons for discharge: treatment goals met and discharge from hospital  Patient/family agrees with progress made and goals achieved: Yes  PT Discharge Precautions/Restrictions Precautions Precautions: Fall Recall of Precautions/Restrictions: Intact Required Braces or Orthoses: Knee Immobilizer - Right Knee Immobilizer - Right: On at all times Restrictions Weight Bearing Restrictions Per Provider Order: Yes RLE Weight Bearing Per Provider Order: Non weight bearing Pain Pain Assessment Pain Scale: 0-10 Pain Score: 0-No pain Pain Location: Leg Pain Intervention(s): Medication (See eMAR);Other (Comment) (scheduled meds) Pain Interference Pain Interference Pain Effect on Sleep: 3. Frequently Pain Interference with Therapy Activities: 4. Almost constantly Pain Interference with Day-to-Day Activities: 4. Almost constantly Vision/Perception  Vision -  History Ability to See in Adequate Light: 0 Adequate Perception Perception: Within Functional Limits Praxis Praxis: WFL  Cognition Overall Cognitive Status: Within Functional Limits for tasks assessed Arousal/Alertness: Awake/alert Attention: Alternating Memory: Appears intact Awareness: Appears intact Problem Solving: Appears intact Safety/Judgment: Appears intact Sensation Sensation Light Touch: Appears Intact Hot/Cold: Appears Intact Proprioception: Appears Intact Stereognosis: Appears Intact Additional Comments: mild chronic numbness dorsum left foot from prior foot surgery Coordination Gross Motor Movements are Fluid and Coordinated: No Fine Motor Movements are Fluid and Coordinated: Yes Motor  Motor Motor - Discharge Observations: limited rotation due to hip and pelvic pain, KI AAT's  Mobility Bed Mobility Bed Mobility: Supine to Sit;Sit to Supine Supine to Sit: Independent Sit to Supine: Independent Transfers Transfers: Squat Pivot Transfers Sit to Stand: Set up assist;Independent with assistive device Squat Pivot Transfers: Independent with assistive device Locomotion  Gait Ambulation: No Gait Gait: No Stairs / Additional Locomotion Stairs: Yes Stairs Assistance: Dependent - Patient 0%;2 Helpers Stair Management Technique: Wheelchair Number of Stairs: 2 Height of Stairs: 6 Pick up small object from the floor (from standing position) activity did not occur: Safety/medical concerns Naval architect Mobility: Yes Wheelchair Assistance: Independent with Scientist, research (life sciences): Both upper extremities Wheelchair Parts Management: Needs assistance Distance: 150  Trunk/Postural Assessment  Cervical Assessment Cervical Assessment: Within Functional Limits Thoracic Assessment Thoracic Assessment: Within Functional Limits Lumbar Assessment Lumbar Assessment: Within Functional Limits Postural Control Postural Control: Within  Functional Limits  Balance Balance Balance Assessed: Yes Static Sitting Balance Static Sitting - Balance Support: Right upper extremity supported;Left upper extremity supported;Feet supported Static Sitting - Level of Assistance: 7: Independent Dynamic Sitting Balance Dynamic Sitting - Balance Support: Right upper extremity supported;Left upper extremity supported;Feet supported;During functional activity Dynamic Sitting - Level of Assistance: 7: Independent Static Standing Balance Static Standing - Balance Support: Bilateral upper extremity supported;During  functional activity Static Standing - Level of Assistance: 4: Min assist Dynamic Standing Balance Dynamic Standing - Balance Support: During functional activity;Bilateral upper extremity supported Dynamic Standing - Level of Assistance: 4: Min assist Extremity Assessment  RUE Assessment RUE Assessment: Within Functional Limits LUE Assessment LUE Assessment: Within Functional Limits RLE Assessment RLE Assessment: Exceptions to [facility] Active Range of Motion (AROM) Comments: ankle WFLs, knee and hip limited by KI and pain General Strength Comments: ankle grossly 4/5, unable to assess knee secondary to KI, unable to formally assess hip due to KI and pain LLE Assessment LLE Assessment: Within Functional Limits   Aishia Barkey P Maanav Kassabian PT 12/09/2023, 3:22 PM

## 2023-12-09 NOTE — Progress Notes (Incomplete)
 Inpatient Rehabilitation Discharge Medication Review by a Pharmacist  A complete drug regimen review was completed for this patient to identify any potential clinically significant medication issues.  High Risk Drug Classes Is patient taking? Indication by Medication  Antipsychotic No   Anticoagulant No   Antimicrobial Yes Valacyclovir - PRN fever blister  Opioid Yes Oxycodone , tramadol  - PRN pain  Antiplatelet No   Hypoglycemics/insulin No   Vasoactive Medication Yes Metoprolol  - PRN palpitations  Chemotherapy No   Other Yes Citalopram  - mood Vit D, Ca Carbonate, Vit B12, MVI- supplement Docusate - constipation Gabapentin  - neuropathy Cyclobenzaprine - PRN muscle spasms Epinephrine pen - PRN anaphylaxis Flonase  - allergic rhinitis Hydroxychloroquine  - mixed connective tissue disorder  Levothyroxine  - hypothyroid Meloxicam - NSAID/pain Lidocaine  patch - topical pain Melatonin - PRN insomnia Tamsulosin  - urinary retention     Type of Medication Issue Identified Description of Issue Recommendation(s)  Drug Interaction(s) (clinically significant)     Duplicate Therapy     Allergy     No Medication Administration End Date     Incorrect Dose     Additional Drug Therapy Needed     Significant med changes from prior encounter (inform family/care partners about these prior to discharge). PTA medications resumed Communicate relevant medication changes to patient/family members at discharge from CIR.   Other       Clinically significant medication issues were identified that warrant physician communication and completion of prescribed/recommended actions by midnight of the next day:  No   Pharmacist comments: n/a   Time spent performing this drug regimen review (minutes): 20   Thank you for allowing pharmacy to be a part of this patient's care.  ***
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
## 2023-12-09 NOTE — Discharge Instructions (Addendum)
 Inpatient Rehab Discharge Instructions  Kathryn Taylor Discharge date and time: 12/10/23   Activities/Precautions/ Functional Status: Activity: activity as tolerated Diet: regular diet Wound Care: keep wound clean and dry   Functional status:  ___ No restrictions     ___ Walk up steps independently ___ 24/7 supervision/assistance   ___ Walk up steps with assistance ___ Intermittent supervision/assistance  ___ Bathe/dress independently ___ Walk with walker     ___ Bathe/dress with assistance ___ Walk Independently    ___ Shower independently ___ Walk with assistance    ___ Shower with assistance ___ No alcohol     ___ Return to work/school ________  Special Instructions:  COMMUNITY REFERRALS UPON DISCHARGE:    Will need outpatient rehab once can weight bear and hop on her pelvis better. Patient in agreement with  Medical Equipment/Items Ordered:drop-arm bedside commode                                                 Agency/Supplier:[facility]   [phone]  My questions have been answered and I understand these instructions. I will adhere to these goals and the provided educational materials after my discharge from the hospital.  Patient/Caregiver Signature _______________________________ Date __________  Clinician Signature _______________________________________ Date __________  Please bring this form and your medication list with you to all your follow-up doctor's appointments.

## 2023-12-09 NOTE — Progress Notes (Signed)
 Occupational Therapy Session Note  Patient Details  Name: Kathryn Taylor MRN: [id number] Date of Birth: 08-May-1961  Today's Date: 12/09/2023 OT Individual Time: 1000-1053 OT Individual Time Calculation (min): 53 min    Short Term Goals: Week 1:  OT Short Term Goal 1 (Week 1): Pt will complete LB dressing at CGA with AE as necessary OT Short Term Goal 2 (Week 1): Pt will complete toilet transfer at CGA with LRAD OT Short Term Goal 3 (Week 1): Pt will complete management of bracing and/or education for spouse to complete bracing at mod I  Skilled Therapeutic Interventions/Progress Updates:   Patient received supine in bed with husband Tura Gaines here for family education.  Session consisted of education relating to BADL - Transfer to/from [facility], to/from tub/shower bench, bathing, dressing, care of RLE/KI.  Hands on training with Alec to stabilize commode seat, wheelchair during transfers as patient completing without physical assistance.  Patient able today to self set up wheelchair for shower transfer.  Discussed furniture transfers, and home set up - where will patient spend the day?  Problem solved through bathroom, sitting area, kitchen maneuvering.  Husband asking if basic self care skills will suffice as enough movement to prevent blood clots.  Discussed that patient is currently on a blood thinner, and that her movement in/out of bed/ to shower/ to commode, to move wheelchair in house, etc should suffice initially.   Husband able to be home with patient initially, then he also works from home at least 4 of 5 days/week.  Both patient and husband familiar with adapted techniques and equipment for bathing, dressing and toileting from prior foot surgery.  Offered time and space to get all questions asked and answered.  Patient returned to room and left up in wheelchair with husband present.    Therapy Documentation Precautions:  Precautions Precautions: Fall Recall of Precautions/Restrictions:  Intact Required Braces or Orthoses: Knee Immobilizer - Right Knee Immobilizer - Right: On at all times Restrictions Weight Bearing Restrictions Per Provider Order: Yes RLE Weight Bearing Per Provider Order: Non weight bearing   Pain: Pain Assessment Pain Scale: 0-10 Pain Score: 0-No pain Faces Pain Scale: No hurt Pain Type: Acute pain Pain Location: Pelvis Pain Orientation: Right Pain Descriptors / Indicators: Aching;Throbbing Pain Frequency: Constant Pain Onset: On-going Patients Stated Pain Goal: 0 Pain Intervention(s): Medication (See eMAR)     Therapy/Group: Individual Therapy  Telisha Zawadzki M 12/09/2023, 10:54 AM
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
## 2023-12-09 NOTE — Progress Notes (Addendum)
 Patient ID: Kathryn Taylor, female   DOB: 10-Jun-1961, 64 y.o.   MRN: [id number]  Husband is present and participating in education to prepare for discharge. Pt's drop-arm bedside commode has been delivered to her room. Pt wants to discharge tomorrow MD in agreement with this. Discharge changed for tomorrow

## 2023-12-10 ENCOUNTER — Other Ambulatory Visit (HOSPITAL_COMMUNITY): Payer: Self-pay

## 2023-12-10 ENCOUNTER — Inpatient Hospital Stay (HOSPITAL_COMMUNITY)

## 2023-12-10 ENCOUNTER — Telehealth (HOSPITAL_COMMUNITY): Payer: Self-pay | Admitting: Pharmacy Technician

## 2023-12-10 DIAGNOSIS — T1490XA Injury, unspecified, initial encounter: Secondary | ICD-10-CM | POA: Diagnosis not present

## 2023-12-10 DIAGNOSIS — M7989 Other specified soft tissue disorders: Secondary | ICD-10-CM

## 2023-12-10 MED ORDER — BISACODYL 10 MG RE SUPP
10.0000 mg | Freq: Every day | RECTAL | 0 refills | Status: AC | PRN
  Filled 2023-12-10: qty 12, 12d supply, fill #0

## 2023-12-10 MED ORDER — ASPIRIN 81 MG PO TBEC
81.0000 mg | DELAYED_RELEASE_TABLET | Freq: Every day | ORAL | 0 refills | Status: DC
  Filled 2023-12-10: qty 60, 60d supply, fill #0

## 2023-12-10 MED ORDER — MELOXICAM 15 MG PO TABS: 15.0000 mg | ORAL_TABLET | Freq: Every day | ORAL | Status: AC | PRN

## 2023-12-10 MED ORDER — POLYETHYLENE GLYCOL 3350 17 GM/SCOOP PO POWD
17.0000 g | Freq: Every day | ORAL | 0 refills | Status: AC | PRN
  Filled 2023-12-10: qty 238, 14d supply, fill #0

## 2023-12-10 MED ORDER — APIXABAN 2.5 MG PO TABS
2.5000 mg | ORAL_TABLET | Freq: Two times a day (BID) | ORAL | 0 refills | Status: AC
  Filled 2023-12-10: qty 60, 30d supply, fill #0

## 2023-12-10 NOTE — Progress Notes (Signed)
 Inpatient Rehabilitation Discharge Medication Review by a Pharmacist  A complete drug regimen review was completed for this patient to identify any potential clinically significant medication issues.  High Risk Drug Classes Is patient taking? Indication by Medication  Antipsychotic No   Anticoagulant No   Antimicrobial Yes Valacyclovir - PRN fever blister  Opioid Yes Oxycodone , tramadol  - PRN pain  Antiplatelet No   Hypoglycemics/insulin No   Vasoactive Medication Yes Metoprolol  - PRN palpitations  Chemotherapy No   Other Yes Citalopram  - mood Vit D, Ca Carbonate, Vit B12, MVI- supplement Docusate - constipation Gabapentin  - neuropathy Cyclobenzaprine/robaxin  - PRN muscle spasms Epinephrine pen - PRN anaphylaxis Flonase  - allergic rhinitis Hydroxychloroquine  - mixed connective tissue disorder  Levothyroxine  - hypothyroid Meloxicam - NSAID/pain Lidocaine  patch - topical pain Melatonin - PRN insomnia Tamsulosin  - urinary retention Tylenol  - pain     Type of Medication Issue Identified Description of Issue Recommendation(s)  Drug Interaction(s) (clinically significant)     Duplicate Therapy     Allergy     No Medication Administration End Date     Incorrect Dose     Additional Drug Therapy Needed     Significant med changes from prior encounter (inform family/care partners about these prior to discharge).    Other       Clinically significant medication issues were identified that warrant physician communication and completion of prescribed/recommended actions by midnight of the next day:  No   Pharmacist comments: n/a   Time spent performing this drug regimen review (minutes): 20   Ivery Marking, PharmD, [place], AAHIVP, CPP Infectious Disease Pharmacist 12/10/2023 7:22 AM
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
## 2023-12-10 NOTE — Telephone Encounter (Signed)
 Pharmacy Patient Advocate Encounter  Received notification from [facility] that Prior Authorization for Lidocaine  5% patches has been DENIED.  Full denial letter will be uploaded to the media tab. See denial reason below.   PA #/Case ID/Reference #: [id number] Key: Aviva Boer

## 2023-12-12 DIAGNOSIS — D62 Acute posthemorrhagic anemia: Secondary | ICD-10-CM | POA: Insufficient documentation

## 2023-12-14 ENCOUNTER — Encounter: Attending: Physical Medicine and Rehabilitation | Admitting: Physical Medicine and Rehabilitation

## 2023-12-14 DIAGNOSIS — J3089 Other allergic rhinitis: Secondary | ICD-10-CM | POA: Diagnosis not present

## 2023-12-14 DIAGNOSIS — J3081 Allergic rhinitis due to animal (cat) (dog) hair and dander: Secondary | ICD-10-CM | POA: Diagnosis not present

## 2023-12-14 DIAGNOSIS — S2241XA Multiple fractures of ribs, right side, initial encounter for closed fracture: Secondary | ICD-10-CM | POA: Diagnosis not present

## 2023-12-14 DIAGNOSIS — S32591A Other specified fracture of right pubis, initial encounter for closed fracture: Secondary | ICD-10-CM | POA: Diagnosis not present

## 2023-12-14 DIAGNOSIS — J301 Allergic rhinitis due to pollen: Secondary | ICD-10-CM | POA: Diagnosis not present

## 2023-12-14 DIAGNOSIS — D709 Neutropenia, unspecified: Secondary | ICD-10-CM | POA: Diagnosis not present

## 2023-12-14 DIAGNOSIS — S82141A Displaced bicondylar fracture of right tibia, initial encounter for closed fracture: Secondary | ICD-10-CM | POA: Diagnosis not present

## 2023-12-14 DIAGNOSIS — K5900 Constipation, unspecified: Secondary | ICD-10-CM

## 2023-12-14 DIAGNOSIS — M25572 Pain in left ankle and joints of left foot: Secondary | ICD-10-CM | POA: Diagnosis not present

## 2023-12-14 MED ORDER — OXYCODONE HCL 5 MG PO TABS
5.0000 mg | ORAL_TABLET | Freq: Three times a day (TID) | ORAL | 0 refills | Status: AC | PRN
Start: 2023-12-14 — End: ?

## 2023-12-14 NOTE — Progress Notes (Signed)
   Subjective:    Patient ID: Kathryn Taylor, female    DOB: Mar 30, 1961, 63 y.o.   MRN: [id number]  HPI An audio/video tele-health visit is felt to be the most appropriate encounter for this patient at this time. This is a follow up tele-visit via phone. The patient is at home. MD is at office. Prior to scheduling this appointment, our staff discussed the limitations of evaluation and management by telemedicine and the availability of in-person appointments. The patient expressed understanding and agreed to proceed.   Kathryn Taylor is a 63 year old woman who presents for follow-up of LLE swelling.  1) LLE swelling:  -stable  2) Constipation: -slow but moving  3) Neutropenic -she is concerned about this  4) Pelvic fracture: -yesterday she tried not to take oxycodone  but felt miserable today without it   Review of Systems     Objective:   Physical Exam Not performed      Assessment & Plan:   1) LLE swelling: -Vas US  results discussed- fluid collection present but no clot  2) Constipation: -discussed that bowels are moving daily  3) Neutropenia: -encouraged follow-up with her rheumatologist regarding when Plaquenil  should be restarted/CBC monitoring  4) Pelvic fracture: -provided oxycodone  refill  5 minutes spent in discussion of VAS US  results, reviewing last CBC with her, discussed following up with rheumatology regarding when Plaquenil  should be started/for monitoring of CBC, provided oxycodone  refill for pain from pelvic fracture

## 2023-12-14 NOTE — Progress Notes (Signed)
 Kathryn Taylor

## 2023-12-21 DIAGNOSIS — J301 Allergic rhinitis due to pollen: Secondary | ICD-10-CM | POA: Diagnosis not present

## 2023-12-21 DIAGNOSIS — Z6827 Body mass index (BMI) 27.0-27.9, adult: Secondary | ICD-10-CM | POA: Diagnosis not present

## 2023-12-21 DIAGNOSIS — D649 Anemia, unspecified: Secondary | ICD-10-CM | POA: Diagnosis not present

## 2023-12-21 DIAGNOSIS — Z09 Encounter for follow-up examination after completed treatment for conditions other than malignant neoplasm: Secondary | ICD-10-CM | POA: Diagnosis not present

## 2023-12-21 DIAGNOSIS — J3089 Other allergic rhinitis: Secondary | ICD-10-CM | POA: Diagnosis not present

## 2023-12-21 DIAGNOSIS — R899 Unspecified abnormal finding in specimens from other organs, systems and tissues: Secondary | ICD-10-CM | POA: Diagnosis not present

## 2023-12-31 DIAGNOSIS — J301 Allergic rhinitis due to pollen: Secondary | ICD-10-CM | POA: Diagnosis not present

## 2023-12-31 DIAGNOSIS — M25561 Pain in right knee: Secondary | ICD-10-CM | POA: Diagnosis not present

## 2023-12-31 DIAGNOSIS — J3089 Other allergic rhinitis: Secondary | ICD-10-CM | POA: Diagnosis not present

## 2023-12-31 DIAGNOSIS — J3081 Allergic rhinitis due to animal (cat) (dog) hair and dander: Secondary | ICD-10-CM | POA: Diagnosis not present

## 2023-12-31 DIAGNOSIS — M25571 Pain in right ankle and joints of right foot: Secondary | ICD-10-CM | POA: Diagnosis not present

## 2024-01-14 DIAGNOSIS — M25561 Pain in right knee: Secondary | ICD-10-CM | POA: Diagnosis not present

## 2024-01-14 DIAGNOSIS — R102 Pelvic and perineal pain: Secondary | ICD-10-CM | POA: Diagnosis not present

## 2024-01-17 ENCOUNTER — Other Ambulatory Visit: Payer: Self-pay | Admitting: Orthopedic Surgery

## 2024-01-17 DIAGNOSIS — J3081 Allergic rhinitis due to animal (cat) (dog) hair and dander: Secondary | ICD-10-CM | POA: Diagnosis not present

## 2024-01-17 DIAGNOSIS — J301 Allergic rhinitis due to pollen: Secondary | ICD-10-CM | POA: Diagnosis not present

## 2024-01-17 DIAGNOSIS — J3089 Other allergic rhinitis: Secondary | ICD-10-CM | POA: Diagnosis not present

## 2024-01-17 DIAGNOSIS — M25561 Pain in right knee: Secondary | ICD-10-CM

## 2024-01-18 ENCOUNTER — Ambulatory Visit
Admission: RE | Admit: 2024-01-18 | Discharge: 2024-01-18 | Disposition: A | Discharge status: Home / Self Care | Source: Ambulatory Visit | Attending: Orthopedic Surgery | Admitting: Orthopedic Surgery

## 2024-01-18 DIAGNOSIS — M25461 Effusion, right knee: Secondary | ICD-10-CM | POA: Diagnosis not present

## 2024-01-18 DIAGNOSIS — S82141A Displaced bicondylar fracture of right tibia, initial encounter for closed fracture: Secondary | ICD-10-CM | POA: Diagnosis not present

## 2024-01-18 DIAGNOSIS — M25561 Pain in right knee: Secondary | ICD-10-CM

## 2024-01-21 DIAGNOSIS — M25561 Pain in right knee: Secondary | ICD-10-CM | POA: Diagnosis not present

## 2024-01-25 DIAGNOSIS — J3081 Allergic rhinitis due to animal (cat) (dog) hair and dander: Secondary | ICD-10-CM | POA: Diagnosis not present

## 2024-01-25 DIAGNOSIS — J301 Allergic rhinitis due to pollen: Secondary | ICD-10-CM | POA: Diagnosis not present

## 2024-01-25 DIAGNOSIS — J3089 Other allergic rhinitis: Secondary | ICD-10-CM | POA: Diagnosis not present

## 2024-01-27 DIAGNOSIS — Z4789 Encounter for other orthopedic aftercare: Secondary | ICD-10-CM | POA: Diagnosis not present

## 2024-01-27 DIAGNOSIS — M19071 Primary osteoarthritis, right ankle and foot: Secondary | ICD-10-CM | POA: Diagnosis not present

## 2024-01-27 DIAGNOSIS — M19072 Primary osteoarthritis, left ankle and foot: Secondary | ICD-10-CM | POA: Diagnosis not present

## 2024-02-07 DIAGNOSIS — M160 Bilateral primary osteoarthritis of hip: Secondary | ICD-10-CM | POA: Diagnosis not present

## 2024-02-07 DIAGNOSIS — S82131A Displaced fracture of medial condyle of right tibia, initial encounter for closed fracture: Secondary | ICD-10-CM | POA: Diagnosis not present

## 2024-02-07 DIAGNOSIS — X58XXXA Exposure to other specified factors, initial encounter: Secondary | ICD-10-CM | POA: Diagnosis not present

## 2024-02-07 DIAGNOSIS — S32591A Other specified fracture of right pubis, initial encounter for closed fracture: Secondary | ICD-10-CM | POA: Diagnosis not present

## 2024-02-07 DIAGNOSIS — S82141A Displaced bicondylar fracture of right tibia, initial encounter for closed fracture: Secondary | ICD-10-CM | POA: Diagnosis not present

## 2024-02-10 DIAGNOSIS — J301 Allergic rhinitis due to pollen: Secondary | ICD-10-CM | POA: Diagnosis not present

## 2024-02-10 DIAGNOSIS — J3089 Other allergic rhinitis: Secondary | ICD-10-CM | POA: Diagnosis not present

## 2024-02-10 DIAGNOSIS — J3081 Allergic rhinitis due to animal (cat) (dog) hair and dander: Secondary | ICD-10-CM | POA: Diagnosis not present

## 2024-02-14 DIAGNOSIS — S82141D Displaced bicondylar fracture of right tibia, subsequent encounter for closed fracture with routine healing: Secondary | ICD-10-CM | POA: Diagnosis not present

## 2024-02-14 DIAGNOSIS — S32591D Other specified fracture of right pubis, subsequent encounter for fracture with routine healing: Secondary | ICD-10-CM | POA: Diagnosis not present

## 2024-02-15 ENCOUNTER — Other Ambulatory Visit: Payer: Self-pay

## 2024-02-24 DIAGNOSIS — N83209 Unspecified ovarian cyst, unspecified side: Secondary | ICD-10-CM | POA: Diagnosis not present

## 2024-02-29 DIAGNOSIS — R102 Pelvic and perineal pain: Secondary | ICD-10-CM | POA: Diagnosis not present

## 2024-02-29 DIAGNOSIS — M25561 Pain in right knee: Secondary | ICD-10-CM | POA: Diagnosis not present

## 2024-03-02 DIAGNOSIS — J3081 Allergic rhinitis due to animal (cat) (dog) hair and dander: Secondary | ICD-10-CM | POA: Diagnosis not present

## 2024-03-02 DIAGNOSIS — J3089 Other allergic rhinitis: Secondary | ICD-10-CM | POA: Diagnosis not present

## 2024-03-02 DIAGNOSIS — J301 Allergic rhinitis due to pollen: Secondary | ICD-10-CM | POA: Diagnosis not present

## 2024-03-06 DIAGNOSIS — S82141D Displaced bicondylar fracture of right tibia, subsequent encounter for closed fracture with routine healing: Secondary | ICD-10-CM | POA: Diagnosis not present

## 2024-03-09 DIAGNOSIS — J3089 Other allergic rhinitis: Secondary | ICD-10-CM | POA: Diagnosis not present

## 2024-03-09 DIAGNOSIS — J3081 Allergic rhinitis due to animal (cat) (dog) hair and dander: Secondary | ICD-10-CM | POA: Diagnosis not present

## 2024-03-09 DIAGNOSIS — J301 Allergic rhinitis due to pollen: Secondary | ICD-10-CM | POA: Diagnosis not present

## 2024-03-15 DIAGNOSIS — S82141D Displaced bicondylar fracture of right tibia, subsequent encounter for closed fracture with routine healing: Secondary | ICD-10-CM | POA: Diagnosis not present

## 2024-03-17 DIAGNOSIS — S82141D Displaced bicondylar fracture of right tibia, subsequent encounter for closed fracture with routine healing: Secondary | ICD-10-CM | POA: Diagnosis not present

## 2024-03-20 DIAGNOSIS — S82141D Displaced bicondylar fracture of right tibia, subsequent encounter for closed fracture with routine healing: Secondary | ICD-10-CM | POA: Diagnosis not present

## 2024-03-23 DIAGNOSIS — J3081 Allergic rhinitis due to animal (cat) (dog) hair and dander: Secondary | ICD-10-CM | POA: Diagnosis not present

## 2024-03-23 DIAGNOSIS — S82141D Displaced bicondylar fracture of right tibia, subsequent encounter for closed fracture with routine healing: Secondary | ICD-10-CM | POA: Diagnosis not present

## 2024-03-23 DIAGNOSIS — J301 Allergic rhinitis due to pollen: Secondary | ICD-10-CM | POA: Diagnosis not present

## 2024-03-23 DIAGNOSIS — J3089 Other allergic rhinitis: Secondary | ICD-10-CM | POA: Diagnosis not present

## 2024-03-27 DIAGNOSIS — S82141D Displaced bicondylar fracture of right tibia, subsequent encounter for closed fracture with routine healing: Secondary | ICD-10-CM | POA: Diagnosis not present

## 2024-03-30 DIAGNOSIS — S82141D Displaced bicondylar fracture of right tibia, subsequent encounter for closed fracture with routine healing: Secondary | ICD-10-CM | POA: Diagnosis not present

## 2024-04-03 DIAGNOSIS — S82141D Displaced bicondylar fracture of right tibia, subsequent encounter for closed fracture with routine healing: Secondary | ICD-10-CM | POA: Diagnosis not present

## 2024-04-10 DIAGNOSIS — S82141D Displaced bicondylar fracture of right tibia, subsequent encounter for closed fracture with routine healing: Secondary | ICD-10-CM | POA: Diagnosis not present

## 2024-04-18 DIAGNOSIS — J3089 Other allergic rhinitis: Secondary | ICD-10-CM | POA: Diagnosis not present

## 2024-04-18 DIAGNOSIS — J301 Allergic rhinitis due to pollen: Secondary | ICD-10-CM | POA: Diagnosis not present

## 2024-04-18 DIAGNOSIS — J3081 Allergic rhinitis due to animal (cat) (dog) hair and dander: Secondary | ICD-10-CM | POA: Diagnosis not present

## 2024-04-19 DIAGNOSIS — S82141D Displaced bicondylar fracture of right tibia, subsequent encounter for closed fracture with routine healing: Secondary | ICD-10-CM | POA: Diagnosis not present

## 2024-04-20 ENCOUNTER — Encounter: Payer: Self-pay | Admitting: Internal Medicine

## 2024-05-04 DIAGNOSIS — M359 Systemic involvement of connective tissue, unspecified: Secondary | ICD-10-CM | POA: Diagnosis not present

## 2024-05-04 DIAGNOSIS — M1991 Primary osteoarthritis, unspecified site: Secondary | ICD-10-CM | POA: Diagnosis not present

## 2024-05-04 DIAGNOSIS — L932 Other local lupus erythematosus: Secondary | ICD-10-CM | POA: Diagnosis not present

## 2024-05-04 DIAGNOSIS — M064 Inflammatory polyarthropathy: Secondary | ICD-10-CM | POA: Diagnosis not present

## 2024-05-08 ENCOUNTER — Ambulatory Visit: Admitting: Physical Therapy

## 2024-05-11 DIAGNOSIS — H43812 Vitreous degeneration, left eye: Secondary | ICD-10-CM | POA: Diagnosis not present

## 2024-05-11 DIAGNOSIS — Z79899 Other long term (current) drug therapy: Secondary | ICD-10-CM | POA: Diagnosis not present

## 2024-05-24 DIAGNOSIS — J3081 Allergic rhinitis due to animal (cat) (dog) hair and dander: Secondary | ICD-10-CM | POA: Diagnosis not present

## 2024-05-24 DIAGNOSIS — J301 Allergic rhinitis due to pollen: Secondary | ICD-10-CM | POA: Diagnosis not present

## 2024-05-24 DIAGNOSIS — J3089 Other allergic rhinitis: Secondary | ICD-10-CM | POA: Diagnosis not present

## 2024-05-30 DIAGNOSIS — J3089 Other allergic rhinitis: Secondary | ICD-10-CM | POA: Diagnosis not present

## 2024-05-30 DIAGNOSIS — J301 Allergic rhinitis due to pollen: Secondary | ICD-10-CM | POA: Diagnosis not present

## 2024-06-06 DIAGNOSIS — J3081 Allergic rhinitis due to animal (cat) (dog) hair and dander: Secondary | ICD-10-CM | POA: Diagnosis not present

## 2024-06-06 DIAGNOSIS — J301 Allergic rhinitis due to pollen: Secondary | ICD-10-CM | POA: Diagnosis not present

## 2024-06-06 DIAGNOSIS — N83201 Unspecified ovarian cyst, right side: Secondary | ICD-10-CM | POA: Diagnosis not present

## 2024-06-06 DIAGNOSIS — R5383 Other fatigue: Secondary | ICD-10-CM | POA: Diagnosis not present

## 2024-06-06 DIAGNOSIS — J3089 Other allergic rhinitis: Secondary | ICD-10-CM | POA: Diagnosis not present

## 2024-06-06 DIAGNOSIS — R5382 Chronic fatigue, unspecified: Secondary | ICD-10-CM | POA: Diagnosis not present

## 2024-06-06 DIAGNOSIS — N83202 Unspecified ovarian cyst, left side: Secondary | ICD-10-CM | POA: Diagnosis not present

## 2024-06-07 DIAGNOSIS — J301 Allergic rhinitis due to pollen: Secondary | ICD-10-CM | POA: Diagnosis not present

## 2024-06-08 DIAGNOSIS — J3089 Other allergic rhinitis: Secondary | ICD-10-CM | POA: Diagnosis not present

## 2024-06-13 DIAGNOSIS — Z01419 Encounter for gynecological examination (general) (routine) without abnormal findings: Secondary | ICD-10-CM | POA: Diagnosis not present

## 2024-06-13 DIAGNOSIS — Z6828 Body mass index (BMI) 28.0-28.9, adult: Secondary | ICD-10-CM | POA: Diagnosis not present

## 2024-06-13 DIAGNOSIS — Z1231 Encounter for screening mammogram for malignant neoplasm of breast: Secondary | ICD-10-CM | POA: Diagnosis not present

## 2024-06-14 DIAGNOSIS — J3081 Allergic rhinitis due to animal (cat) (dog) hair and dander: Secondary | ICD-10-CM | POA: Diagnosis not present

## 2024-06-14 DIAGNOSIS — J301 Allergic rhinitis due to pollen: Secondary | ICD-10-CM | POA: Diagnosis not present

## 2024-06-14 DIAGNOSIS — Z13820 Encounter for screening for osteoporosis: Secondary | ICD-10-CM | POA: Diagnosis not present

## 2024-06-14 DIAGNOSIS — J3089 Other allergic rhinitis: Secondary | ICD-10-CM | POA: Diagnosis not present

## 2024-06-20 DIAGNOSIS — H43812 Vitreous degeneration, left eye: Secondary | ICD-10-CM | POA: Diagnosis not present

## 2024-06-22 DIAGNOSIS — J3081 Allergic rhinitis due to animal (cat) (dog) hair and dander: Secondary | ICD-10-CM | POA: Diagnosis not present

## 2024-06-22 DIAGNOSIS — J3089 Other allergic rhinitis: Secondary | ICD-10-CM | POA: Diagnosis not present

## 2024-06-22 DIAGNOSIS — J301 Allergic rhinitis due to pollen: Secondary | ICD-10-CM | POA: Diagnosis not present

## 2024-06-27 DIAGNOSIS — J3081 Allergic rhinitis due to animal (cat) (dog) hair and dander: Secondary | ICD-10-CM | POA: Diagnosis not present

## 2024-06-27 DIAGNOSIS — J3089 Other allergic rhinitis: Secondary | ICD-10-CM | POA: Diagnosis not present

## 2024-06-27 DIAGNOSIS — J301 Allergic rhinitis due to pollen: Secondary | ICD-10-CM | POA: Diagnosis not present

## 2024-07-04 DIAGNOSIS — Z23 Encounter for immunization: Secondary | ICD-10-CM | POA: Diagnosis not present

## 2024-07-04 DIAGNOSIS — L570 Actinic keratosis: Secondary | ICD-10-CM | POA: Diagnosis not present

## 2024-07-04 DIAGNOSIS — L565 Disseminated superficial actinic porokeratosis (DSAP): Secondary | ICD-10-CM | POA: Diagnosis not present

## 2024-07-04 DIAGNOSIS — L820 Inflamed seborrheic keratosis: Secondary | ICD-10-CM | POA: Diagnosis not present

## 2024-07-04 DIAGNOSIS — D1800 Hemangioma unspecified site: Secondary | ICD-10-CM | POA: Diagnosis not present

## 2024-07-04 DIAGNOSIS — J301 Allergic rhinitis due to pollen: Secondary | ICD-10-CM | POA: Diagnosis not present

## 2024-07-04 DIAGNOSIS — J3081 Allergic rhinitis due to animal (cat) (dog) hair and dander: Secondary | ICD-10-CM | POA: Diagnosis not present

## 2024-07-04 DIAGNOSIS — D485 Neoplasm of uncertain behavior of skin: Secondary | ICD-10-CM | POA: Diagnosis not present

## 2024-07-04 DIAGNOSIS — J3089 Other allergic rhinitis: Secondary | ICD-10-CM | POA: Diagnosis not present

## 2024-07-04 DIAGNOSIS — L814 Other melanin hyperpigmentation: Secondary | ICD-10-CM | POA: Diagnosis not present

## 2024-07-04 DIAGNOSIS — L821 Other seborrheic keratosis: Secondary | ICD-10-CM | POA: Diagnosis not present

## 2024-07-04 DIAGNOSIS — D225 Melanocytic nevi of trunk: Secondary | ICD-10-CM | POA: Diagnosis not present

## 2024-07-07 DIAGNOSIS — Z23 Encounter for immunization: Secondary | ICD-10-CM | POA: Diagnosis not present

## 2024-07-11 DIAGNOSIS — J301 Allergic rhinitis due to pollen: Secondary | ICD-10-CM | POA: Diagnosis not present

## 2024-07-11 DIAGNOSIS — J3081 Allergic rhinitis due to animal (cat) (dog) hair and dander: Secondary | ICD-10-CM | POA: Diagnosis not present

## 2024-07-11 DIAGNOSIS — J3089 Other allergic rhinitis: Secondary | ICD-10-CM | POA: Diagnosis not present

## 2024-07-26 DIAGNOSIS — J3081 Allergic rhinitis due to animal (cat) (dog) hair and dander: Secondary | ICD-10-CM | POA: Diagnosis not present

## 2024-07-26 DIAGNOSIS — J3089 Other allergic rhinitis: Secondary | ICD-10-CM | POA: Diagnosis not present

## 2024-07-26 DIAGNOSIS — J301 Allergic rhinitis due to pollen: Secondary | ICD-10-CM | POA: Diagnosis not present
